# Patient Record
Sex: Male | Born: 1960
Health system: Southern US, Community
[De-identification: ages and names within clinical notes are randomized; demographics above are authoritative.]

## PROBLEM LIST (undated history)

## (undated) DIAGNOSIS — F32A Depression, unspecified: Secondary | ICD-10-CM

## (undated) DIAGNOSIS — M1990 Unspecified osteoarthritis, unspecified site: Secondary | ICD-10-CM

## (undated) DIAGNOSIS — F329 Major depressive disorder, single episode, unspecified: Secondary | ICD-10-CM

## (undated) DIAGNOSIS — I10 Essential (primary) hypertension: Secondary | ICD-10-CM

## (undated) DIAGNOSIS — IMO0002 Reserved for concepts with insufficient information to code with codable children: Secondary | ICD-10-CM

## (undated) DIAGNOSIS — E119 Type 2 diabetes mellitus without complications: Secondary | ICD-10-CM

## (undated) DIAGNOSIS — K219 Gastro-esophageal reflux disease without esophagitis: Secondary | ICD-10-CM

## (undated) HISTORY — DX: Type 2 diabetes mellitus without complications: E11.9

## (undated) HISTORY — DX: Major depressive disorder, single episode, unspecified: F32.9

## (undated) HISTORY — DX: Unspecified osteoarthritis, unspecified site: M19.90

## (undated) HISTORY — DX: Reserved for concepts with insufficient information to code with codable children: IMO0002

## (undated) HISTORY — DX: Essential (primary) hypertension: I10

## (undated) HISTORY — DX: Depression, unspecified: F32.A

## (undated) HISTORY — DX: Gastro-esophageal reflux disease without esophagitis: K21.9

---

## 1993-01-28 HISTORY — PX: LUMBAR LAMINECTOMY: SHX95

## 1996-01-29 DIAGNOSIS — IMO0002 Reserved for concepts with insufficient information to code with codable children: Secondary | ICD-10-CM

## 1996-01-29 HISTORY — DX: Reserved for concepts with insufficient information to code with codable children: IMO0002

## 1998-09-21 ENCOUNTER — Emergency Department (HOSPITAL_COMMUNITY): Admission: EM | Admit: 1998-09-21 | Discharge: 1998-09-21 | Payer: Self-pay | Admitting: Emergency Medicine

## 1998-09-21 ENCOUNTER — Encounter: Payer: Self-pay | Admitting: Emergency Medicine

## 1999-09-12 ENCOUNTER — Encounter
Admission: RE | Admit: 1999-09-12 | Discharge: 1999-10-02 | Payer: Self-pay | Admitting: Physical Medicine & Rehabilitation

## 1999-10-08 ENCOUNTER — Encounter
Admission: RE | Admit: 1999-10-08 | Discharge: 1999-10-11 | Payer: Self-pay | Admitting: Physical Medicine & Rehabilitation

## 2000-01-29 HISTORY — PX: NECK SURGERY: SHX720

## 2000-08-25 ENCOUNTER — Ambulatory Visit: Admission: RE | Admit: 2000-08-25 | Discharge: 2000-08-25 | Payer: Self-pay | Admitting: Neurosurgery

## 2000-10-29 ENCOUNTER — Encounter: Payer: Self-pay | Admitting: Neurosurgery

## 2000-10-31 ENCOUNTER — Encounter: Payer: Self-pay | Admitting: Neurosurgery

## 2000-10-31 ENCOUNTER — Observation Stay (HOSPITAL_COMMUNITY): Admission: RE | Admit: 2000-10-31 | Discharge: 2000-11-01 | Payer: Self-pay | Admitting: Neurosurgery

## 2000-11-12 ENCOUNTER — Encounter: Payer: Self-pay | Admitting: Neurosurgery

## 2000-11-12 ENCOUNTER — Encounter: Admission: RE | Admit: 2000-11-12 | Discharge: 2000-11-12 | Payer: Self-pay | Admitting: Neurosurgery

## 2001-02-11 ENCOUNTER — Encounter: Admission: RE | Admit: 2001-02-11 | Discharge: 2001-03-31 | Payer: Self-pay | Admitting: Neurosurgery

## 2001-07-02 ENCOUNTER — Encounter: Admission: RE | Admit: 2001-07-02 | Discharge: 2001-07-02 | Payer: Self-pay | Admitting: Neurosurgery

## 2002-05-10 ENCOUNTER — Encounter: Payer: Self-pay | Admitting: Internal Medicine

## 2002-05-10 ENCOUNTER — Inpatient Hospital Stay (HOSPITAL_COMMUNITY): Admission: AD | Admit: 2002-05-10 | Discharge: 2002-05-13 | Payer: Self-pay | Admitting: Internal Medicine

## 2002-05-11 ENCOUNTER — Encounter: Payer: Self-pay | Admitting: Internal Medicine

## 2002-05-12 ENCOUNTER — Encounter (INDEPENDENT_AMBULATORY_CARE_PROVIDER_SITE_OTHER): Payer: Self-pay | Admitting: *Deleted

## 2002-05-13 ENCOUNTER — Encounter (INDEPENDENT_AMBULATORY_CARE_PROVIDER_SITE_OTHER): Payer: Self-pay | Admitting: Specialist

## 2003-05-13 ENCOUNTER — Encounter
Admission: RE | Admit: 2003-05-13 | Discharge: 2003-08-11 | Payer: Self-pay | Admitting: Physical Medicine & Rehabilitation

## 2003-07-05 ENCOUNTER — Encounter
Admission: RE | Admit: 2003-07-05 | Discharge: 2003-09-05 | Payer: Self-pay | Admitting: Physical Medicine & Rehabilitation

## 2003-08-18 ENCOUNTER — Encounter
Admission: RE | Admit: 2003-08-18 | Discharge: 2003-10-07 | Payer: Self-pay | Admitting: Physical Medicine & Rehabilitation

## 2003-10-07 ENCOUNTER — Encounter
Admission: RE | Admit: 2003-10-07 | Discharge: 2004-01-05 | Payer: Self-pay | Admitting: Physical Medicine & Rehabilitation

## 2003-10-10 ENCOUNTER — Ambulatory Visit: Payer: Self-pay | Admitting: Physical Medicine & Rehabilitation

## 2004-01-03 ENCOUNTER — Encounter
Admission: RE | Admit: 2004-01-03 | Discharge: 2004-01-03 | Payer: Self-pay | Admitting: Physical Medicine & Rehabilitation

## 2004-01-09 ENCOUNTER — Encounter
Admission: RE | Admit: 2004-01-09 | Discharge: 2004-02-27 | Payer: Self-pay | Admitting: Physical Medicine & Rehabilitation

## 2004-02-27 ENCOUNTER — Encounter
Admission: RE | Admit: 2004-02-27 | Discharge: 2004-05-27 | Payer: Self-pay | Admitting: Physical Medicine & Rehabilitation

## 2004-02-29 ENCOUNTER — Ambulatory Visit: Payer: Self-pay | Admitting: Physical Medicine & Rehabilitation

## 2004-05-02 ENCOUNTER — Ambulatory Visit: Payer: Self-pay | Admitting: Physical Medicine & Rehabilitation

## 2004-06-06 ENCOUNTER — Encounter
Admission: RE | Admit: 2004-06-06 | Discharge: 2004-09-04 | Payer: Self-pay | Admitting: Physical Medicine & Rehabilitation

## 2004-08-03 ENCOUNTER — Ambulatory Visit: Payer: Self-pay | Admitting: Physical Medicine & Rehabilitation

## 2004-09-05 ENCOUNTER — Encounter
Admission: RE | Admit: 2004-09-05 | Discharge: 2004-12-04 | Payer: Self-pay | Admitting: Physical Medicine and Rehabilitation

## 2004-10-04 ENCOUNTER — Ambulatory Visit: Payer: Self-pay | Admitting: Physical Medicine & Rehabilitation

## 2004-11-06 ENCOUNTER — Ambulatory Visit: Payer: Self-pay | Admitting: Physical Medicine & Rehabilitation

## 2004-12-03 ENCOUNTER — Encounter
Admission: RE | Admit: 2004-12-03 | Discharge: 2005-03-03 | Payer: Self-pay | Admitting: Physical Medicine & Rehabilitation

## 2005-01-12 ENCOUNTER — Ambulatory Visit: Payer: Self-pay | Admitting: Physical Medicine & Rehabilitation

## 2005-03-11 ENCOUNTER — Encounter
Admission: RE | Admit: 2005-03-11 | Discharge: 2005-06-09 | Payer: Self-pay | Admitting: Physical Medicine & Rehabilitation

## 2005-03-11 ENCOUNTER — Ambulatory Visit: Payer: Self-pay | Admitting: Physical Medicine & Rehabilitation

## 2005-04-11 ENCOUNTER — Ambulatory Visit: Payer: Self-pay | Admitting: Physical Medicine & Rehabilitation

## 2005-05-16 ENCOUNTER — Ambulatory Visit: Payer: Self-pay | Admitting: Physical Medicine & Rehabilitation

## 2005-06-12 ENCOUNTER — Encounter
Admission: RE | Admit: 2005-06-12 | Discharge: 2005-09-10 | Payer: Self-pay | Admitting: Physical Medicine & Rehabilitation

## 2005-06-12 ENCOUNTER — Ambulatory Visit: Payer: Self-pay | Admitting: Physical Medicine & Rehabilitation

## 2005-07-11 ENCOUNTER — Ambulatory Visit: Payer: Self-pay | Admitting: Physical Medicine & Rehabilitation

## 2005-08-02 ENCOUNTER — Encounter: Admission: RE | Admit: 2005-08-02 | Discharge: 2005-10-31 | Payer: Self-pay | Admitting: Anesthesiology

## 2005-08-06 ENCOUNTER — Ambulatory Visit: Payer: Self-pay | Admitting: Anesthesiology

## 2005-10-10 ENCOUNTER — Ambulatory Visit: Payer: Self-pay | Admitting: Physical Medicine & Rehabilitation

## 2005-10-10 ENCOUNTER — Encounter: Admission: RE | Admit: 2005-10-10 | Discharge: 2006-01-08 | Payer: Self-pay | Admitting: Urology

## 2005-11-11 ENCOUNTER — Encounter: Admission: RE | Admit: 2005-11-11 | Discharge: 2006-02-09 | Payer: Self-pay | Admitting: Anesthesiology

## 2005-11-12 ENCOUNTER — Ambulatory Visit: Payer: Self-pay | Admitting: Anesthesiology

## 2005-12-09 ENCOUNTER — Ambulatory Visit: Payer: Self-pay | Admitting: Physical Medicine & Rehabilitation

## 2006-02-05 ENCOUNTER — Encounter
Admission: RE | Admit: 2006-02-05 | Discharge: 2006-05-06 | Payer: Self-pay | Admitting: Physical Medicine & Rehabilitation

## 2006-02-05 ENCOUNTER — Ambulatory Visit: Payer: Self-pay | Admitting: Physical Medicine & Rehabilitation

## 2006-03-31 ENCOUNTER — Encounter
Admission: RE | Admit: 2006-03-31 | Discharge: 2006-03-31 | Payer: Self-pay | Admitting: Physical Medicine & Rehabilitation

## 2007-05-09 ENCOUNTER — Encounter: Payer: Self-pay | Admitting: Family Medicine

## 2007-05-27 ENCOUNTER — Ambulatory Visit: Payer: Self-pay | Admitting: Family Medicine

## 2007-05-27 DIAGNOSIS — I152 Hypertension secondary to endocrine disorders: Secondary | ICD-10-CM | POA: Insufficient documentation

## 2007-05-27 DIAGNOSIS — F329 Major depressive disorder, single episode, unspecified: Secondary | ICD-10-CM | POA: Insufficient documentation

## 2007-05-27 DIAGNOSIS — K219 Gastro-esophageal reflux disease without esophagitis: Secondary | ICD-10-CM | POA: Insufficient documentation

## 2007-05-27 DIAGNOSIS — M1990 Unspecified osteoarthritis, unspecified site: Secondary | ICD-10-CM | POA: Insufficient documentation

## 2007-05-27 DIAGNOSIS — F3289 Other specified depressive episodes: Secondary | ICD-10-CM | POA: Insufficient documentation

## 2007-05-27 DIAGNOSIS — E1159 Type 2 diabetes mellitus with other circulatory complications: Secondary | ICD-10-CM | POA: Insufficient documentation

## 2007-05-27 DIAGNOSIS — E1165 Type 2 diabetes mellitus with hyperglycemia: Secondary | ICD-10-CM | POA: Insufficient documentation

## 2007-05-27 DIAGNOSIS — M545 Low back pain, unspecified: Secondary | ICD-10-CM | POA: Insufficient documentation

## 2007-05-27 DIAGNOSIS — Z862 Personal history of diseases of the blood and blood-forming organs and certain disorders involving the immune mechanism: Secondary | ICD-10-CM | POA: Insufficient documentation

## 2007-05-27 DIAGNOSIS — E785 Hyperlipidemia, unspecified: Secondary | ICD-10-CM

## 2007-05-27 DIAGNOSIS — I10 Essential (primary) hypertension: Secondary | ICD-10-CM

## 2007-05-27 DIAGNOSIS — E1169 Type 2 diabetes mellitus with other specified complication: Secondary | ICD-10-CM | POA: Insufficient documentation

## 2007-07-02 ENCOUNTER — Ambulatory Visit: Payer: Self-pay | Admitting: Family Medicine

## 2007-07-13 ENCOUNTER — Ambulatory Visit: Payer: Self-pay | Admitting: Family Medicine

## 2007-07-15 ENCOUNTER — Ambulatory Visit: Payer: Self-pay | Admitting: Family Medicine

## 2007-07-16 LAB — CONVERTED CEMR LAB
ALT: 35 units/L (ref 0–53)
AST: 39 units/L — ABNORMAL HIGH (ref 0–37)
Albumin: 3.4 g/dL — ABNORMAL LOW (ref 3.5–5.2)
Alkaline Phosphatase: 50 units/L (ref 39–117)
BUN: 5 mg/dL — ABNORMAL LOW (ref 6–23)
CO2: 30 meq/L (ref 19–32)
Chloride: 98 meq/L (ref 96–112)
Cholesterol: 193 mg/dL (ref 0–200)
Creatinine, Ser: 0.9 mg/dL (ref 0.4–1.5)
Creatinine,U: 106.2 mg/dL
GFR calc non Af Amer: 96 mL/min
Glucose, Bld: 116 mg/dL — ABNORMAL HIGH (ref 70–99)
LDL Cholesterol: 131 mg/dL — ABNORMAL HIGH (ref 0–99)
Potassium: 4.7 meq/L (ref 3.5–5.1)
Total Protein: 7.7 g/dL (ref 6.0–8.3)
Triglycerides: 189 mg/dL — ABNORMAL HIGH (ref 0–149)

## 2007-07-27 ENCOUNTER — Telehealth: Payer: Self-pay | Admitting: Family Medicine

## 2007-08-04 ENCOUNTER — Encounter: Payer: Self-pay | Admitting: Family Medicine

## 2007-09-22 ENCOUNTER — Telehealth: Payer: Self-pay | Admitting: Family Medicine

## 2007-10-29 HISTORY — PX: ESOPHAGOGASTRODUODENOSCOPY: SHX1529

## 2007-11-11 ENCOUNTER — Ambulatory Visit: Payer: Self-pay | Admitting: Family Medicine

## 2007-11-20 LAB — CONVERTED CEMR LAB
Direct LDL: 114.4 mg/dL
HDL: 37.8 mg/dL — ABNORMAL LOW (ref >39.0)
Hgb A1c MFr Bld: 10.5 % — ABNORMAL HIGH (ref 4.6–6.0)
Total CHOL/HDL Ratio: 5.2
VLDL: 55 mg/dL — ABNORMAL HIGH (ref 0–40)

## 2007-11-30 LAB — HM COLONOSCOPY

## 2007-12-02 ENCOUNTER — Encounter: Payer: Self-pay | Admitting: Family Medicine

## 2007-12-04 ENCOUNTER — Ambulatory Visit: Payer: Self-pay | Admitting: Family Medicine

## 2007-12-04 LAB — CONVERTED CEMR LAB: Cholesterol, target level: 200 mg/dL

## 2007-12-17 ENCOUNTER — Encounter: Payer: Self-pay | Admitting: Family Medicine

## 2008-01-11 ENCOUNTER — Telehealth: Payer: Self-pay | Admitting: Family Medicine

## 2008-02-17 ENCOUNTER — Telehealth: Payer: Self-pay | Admitting: Family Medicine

## 2008-03-02 ENCOUNTER — Ambulatory Visit: Payer: Self-pay | Admitting: Family Medicine

## 2008-03-03 LAB — CONVERTED CEMR LAB
AST: 47 units/L — ABNORMAL HIGH (ref 0–37)
Albumin: 3.8 g/dL (ref 3.5–5.2)
Alkaline Phosphatase: 37 units/L — ABNORMAL LOW (ref 39–117)
BUN: 10 mg/dL (ref 6–23)
Chloride: 95 meq/L — ABNORMAL LOW (ref 96–112)
Cholesterol: 164 mg/dL (ref 0–200)
GFR calc Af Amer: 132 mL/min
GFR calc non Af Amer: 109 mL/min
Hgb A1c MFr Bld: 8.6 % — ABNORMAL HIGH (ref 4.6–6.0)
LDL Cholesterol: 83 mg/dL (ref 0–99)
Potassium: 4.5 meq/L (ref 3.5–5.1)
Total Bilirubin: 0.6 mg/dL (ref 0.3–1.2)
Total CHOL/HDL Ratio: 2.6
VLDL: 17 mg/dL (ref 0–40)

## 2008-03-07 ENCOUNTER — Ambulatory Visit: Payer: Self-pay | Admitting: Family Medicine

## 2008-03-07 DIAGNOSIS — M6740 Ganglion, unspecified site: Secondary | ICD-10-CM | POA: Insufficient documentation

## 2008-03-28 ENCOUNTER — Telehealth: Payer: Self-pay | Admitting: Family Medicine

## 2008-04-05 ENCOUNTER — Encounter: Payer: Self-pay | Admitting: Family Medicine

## 2008-05-11 ENCOUNTER — Telehealth: Payer: Self-pay | Admitting: Family Medicine

## 2008-05-13 ENCOUNTER — Telehealth: Payer: Self-pay | Admitting: Family Medicine

## 2008-06-14 ENCOUNTER — Ambulatory Visit: Payer: Self-pay | Admitting: Family Medicine

## 2008-06-16 ENCOUNTER — Ambulatory Visit: Payer: Self-pay | Admitting: Family Medicine

## 2008-06-16 DIAGNOSIS — F528 Other sexual dysfunction not due to a substance or known physiological condition: Secondary | ICD-10-CM | POA: Insufficient documentation

## 2008-07-12 ENCOUNTER — Telehealth: Payer: Self-pay | Admitting: Family Medicine

## 2008-08-22 ENCOUNTER — Telehealth: Payer: Self-pay | Admitting: Family Medicine

## 2008-09-21 ENCOUNTER — Telehealth: Payer: Self-pay | Admitting: Family Medicine

## 2008-09-22 ENCOUNTER — Ambulatory Visit: Payer: Self-pay | Admitting: Family Medicine

## 2008-09-23 LAB — CONVERTED CEMR LAB
ALT: 52 units/L (ref 0–53)
Albumin: 4.4 g/dL (ref 3.5–5.2)
BUN: 7 mg/dL (ref 6–23)
CO2: 31 meq/L (ref 19–32)
Calcium: 9.7 mg/dL (ref 8.4–10.5)
Chloride: 94 meq/L — ABNORMAL LOW (ref 96–112)
Cholesterol: 185 mg/dL (ref 0–200)
Creatinine, Ser: 1 mg/dL (ref 0.4–1.5)
Creatinine,U: 137.5 mg/dL
HDL: 57.7 mg/dL (ref >39.00)
Hgb A1c MFr Bld: 7.1 % — ABNORMAL HIGH (ref 4.6–6.5)
Microalb, Ur: 7.4 mg/dL — ABNORMAL HIGH (ref 0.0–1.9)
Total CHOL/HDL Ratio: 3
Total Protein: 8.1 g/dL (ref 6.0–8.3)
Triglycerides: 96 mg/dL (ref 0.0–149.0)

## 2008-09-29 ENCOUNTER — Ambulatory Visit: Payer: Self-pay | Admitting: Family Medicine

## 2008-09-29 DIAGNOSIS — R809 Proteinuria, unspecified: Secondary | ICD-10-CM | POA: Insufficient documentation

## 2008-10-07 ENCOUNTER — Ambulatory Visit: Payer: Self-pay | Admitting: Family Medicine

## 2008-10-07 ENCOUNTER — Encounter: Admission: RE | Admit: 2008-10-07 | Discharge: 2008-10-07 | Payer: Self-pay | Admitting: Family Medicine

## 2008-10-11 ENCOUNTER — Telehealth (INDEPENDENT_AMBULATORY_CARE_PROVIDER_SITE_OTHER): Payer: Self-pay | Admitting: Internal Medicine

## 2008-11-04 ENCOUNTER — Telehealth: Payer: Self-pay | Admitting: Family Medicine

## 2008-11-07 ENCOUNTER — Telehealth (INDEPENDENT_AMBULATORY_CARE_PROVIDER_SITE_OTHER): Payer: Self-pay | Admitting: Internal Medicine

## 2008-11-30 ENCOUNTER — Encounter (INDEPENDENT_AMBULATORY_CARE_PROVIDER_SITE_OTHER): Payer: Self-pay | Admitting: Internal Medicine

## 2008-12-13 ENCOUNTER — Encounter (INDEPENDENT_AMBULATORY_CARE_PROVIDER_SITE_OTHER): Payer: Self-pay | Admitting: Internal Medicine

## 2008-12-20 ENCOUNTER — Telehealth: Payer: Self-pay | Admitting: Family Medicine

## 2008-12-26 ENCOUNTER — Telehealth: Payer: Self-pay | Admitting: Family Medicine

## 2008-12-30 ENCOUNTER — Ambulatory Visit: Payer: Self-pay | Admitting: Family Medicine

## 2008-12-30 LAB — CONVERTED CEMR LAB
ALT: 57 units/L — ABNORMAL HIGH (ref 0–53)
BUN: 3 mg/dL — ABNORMAL LOW (ref 6–23)
CO2: 32 meq/L (ref 19–32)
Chloride: 97 meq/L (ref 96–112)
Cholesterol: 176 mg/dL (ref 0–200)
Creatinine, Ser: 0.9 mg/dL (ref 0.4–1.5)
Hgb A1c MFr Bld: 8.4 % — ABNORMAL HIGH (ref 4.6–6.5)
LDL Cholesterol: 96 mg/dL (ref 0–99)
Total Protein: 8 g/dL (ref 6.0–8.3)
Triglycerides: 106 mg/dL (ref 0.0–149.0)

## 2009-01-05 ENCOUNTER — Ambulatory Visit: Payer: Self-pay | Admitting: Family Medicine

## 2009-01-05 DIAGNOSIS — F1010 Alcohol abuse, uncomplicated: Secondary | ICD-10-CM | POA: Insufficient documentation

## 2009-01-05 DIAGNOSIS — R740 Nonspecific elevation of levels of transaminase and lactic acid dehydrogenase [LDH]: Secondary | ICD-10-CM

## 2009-01-05 DIAGNOSIS — R7401 Elevation of levels of liver transaminase levels: Secondary | ICD-10-CM | POA: Insufficient documentation

## 2009-01-06 LAB — CONVERTED CEMR LAB
HCV Ab: NEGATIVE
Hep B C IgM: NEGATIVE
Hepatitis B Surface Ag: NEGATIVE
Total Bilirubin: 1.2 mg/dL (ref 0.3–1.2)

## 2009-01-13 ENCOUNTER — Telehealth: Payer: Self-pay | Admitting: Family Medicine

## 2009-03-01 ENCOUNTER — Telehealth: Payer: Self-pay | Admitting: Family Medicine

## 2009-03-02 ENCOUNTER — Encounter: Payer: Self-pay | Admitting: Family Medicine

## 2009-03-07 ENCOUNTER — Telehealth: Payer: Self-pay | Admitting: Family Medicine

## 2009-04-05 ENCOUNTER — Ambulatory Visit: Payer: Self-pay | Admitting: Family Medicine

## 2009-04-10 LAB — CONVERTED CEMR LAB
ALT: 49 units/L (ref 0–53)
AST: 68 units/L — ABNORMAL HIGH (ref 0–37)
Albumin: 4.1 g/dL (ref 3.5–5.2)
BUN: 2 mg/dL — ABNORMAL LOW (ref 6–23)
Chloride: 95 meq/L — ABNORMAL LOW (ref 96–112)
Cholesterol: 153 mg/dL (ref 0–200)
Glucose, Bld: 169 mg/dL — ABNORMAL HIGH (ref 70–99)
Hgb A1c MFr Bld: 7.7 % — ABNORMAL HIGH (ref 4.6–6.5)
Potassium: 4.3 meq/L (ref 3.5–5.1)
Total Bilirubin: 0.7 mg/dL (ref 0.3–1.2)

## 2009-04-11 ENCOUNTER — Ambulatory Visit: Payer: Self-pay | Admitting: Family Medicine

## 2009-05-16 ENCOUNTER — Telehealth: Payer: Self-pay | Admitting: Family Medicine

## 2009-06-13 ENCOUNTER — Telehealth: Payer: Self-pay | Admitting: Family Medicine

## 2009-07-10 ENCOUNTER — Ambulatory Visit: Payer: Self-pay | Admitting: Family Medicine

## 2009-07-10 LAB — CONVERTED CEMR LAB
Albumin: 4 g/dL (ref 3.5–5.2)
BUN: 8 mg/dL (ref 6–23)
Bilirubin, Direct: 0.2 mg/dL (ref 0.0–0.3)
CO2: 31 meq/L (ref 19–32)
Calcium: 9.3 mg/dL (ref 8.4–10.5)
Cholesterol: 152 mg/dL (ref 0–200)
Creatinine, Ser: 0.7 mg/dL (ref 0.4–1.5)
HDL: 56.7 mg/dL (ref >39.00)
Total CHOL/HDL Ratio: 3
Total Protein: 7.3 g/dL (ref 6.0–8.3)
Triglycerides: 87 mg/dL (ref 0.0–149.0)

## 2009-07-13 ENCOUNTER — Ambulatory Visit: Payer: Self-pay | Admitting: Internal Medicine

## 2009-07-13 ENCOUNTER — Ambulatory Visit: Payer: Self-pay | Admitting: Family Medicine

## 2009-08-16 ENCOUNTER — Ambulatory Visit: Payer: Self-pay | Admitting: Family Medicine

## 2009-08-16 DIAGNOSIS — M25519 Pain in unspecified shoulder: Secondary | ICD-10-CM | POA: Insufficient documentation

## 2009-08-22 ENCOUNTER — Telehealth: Payer: Self-pay | Admitting: Family Medicine

## 2009-08-25 ENCOUNTER — Telehealth: Payer: Self-pay | Admitting: Family Medicine

## 2009-10-04 ENCOUNTER — Telehealth: Payer: Self-pay | Admitting: Family Medicine

## 2009-10-12 ENCOUNTER — Encounter: Payer: Self-pay | Admitting: Family Medicine

## 2009-10-31 ENCOUNTER — Ambulatory Visit: Payer: Self-pay | Admitting: Family Medicine

## 2009-11-01 LAB — CONVERTED CEMR LAB
ALT: 60 units/L — ABNORMAL HIGH (ref 0–53)
AST: 106 units/L — ABNORMAL HIGH (ref 0–37)
Alkaline Phosphatase: 45 units/L (ref 39–117)
Bilirubin, Direct: 0.2 mg/dL (ref 0.0–0.3)
Total Bilirubin: 0.5 mg/dL (ref 0.3–1.2)

## 2009-11-03 ENCOUNTER — Telehealth: Payer: Self-pay | Admitting: Family Medicine

## 2009-11-07 ENCOUNTER — Ambulatory Visit: Payer: Self-pay | Admitting: Family Medicine

## 2009-11-07 DIAGNOSIS — L570 Actinic keratosis: Secondary | ICD-10-CM | POA: Insufficient documentation

## 2009-11-08 ENCOUNTER — Telehealth: Payer: Self-pay | Admitting: Family Medicine

## 2009-11-08 ENCOUNTER — Encounter: Admission: RE | Admit: 2009-11-08 | Discharge: 2009-11-08 | Payer: Self-pay | Admitting: Family Medicine

## 2009-12-14 ENCOUNTER — Telehealth: Payer: Self-pay | Admitting: Family Medicine

## 2010-01-15 ENCOUNTER — Telehealth: Payer: Self-pay | Admitting: Family Medicine

## 2010-02-02 ENCOUNTER — Other Ambulatory Visit: Payer: Self-pay | Admitting: Family Medicine

## 2010-02-02 ENCOUNTER — Ambulatory Visit
Admission: RE | Admit: 2010-02-02 | Discharge: 2010-02-02 | Payer: Self-pay | Source: Home / Self Care | Attending: Family Medicine | Admitting: Family Medicine

## 2010-02-02 LAB — LIPID PANEL
Cholesterol: 194 mg/dL (ref 0–200)
HDL: 63.7 mg/dL (ref >39.00)
LDL Cholesterol: 95 mg/dL (ref 0–99)
Total CHOL/HDL Ratio: 3
Triglycerides: 179 mg/dL — ABNORMAL HIGH (ref 0.0–149.0)
VLDL: 35.8 mg/dL (ref 0.0–40.0)

## 2010-02-02 LAB — BASIC METABOLIC PANEL
BUN: 12 mg/dL (ref 6–23)
CO2: 29 mEq/L (ref 19–32)
Calcium: 9.8 mg/dL (ref 8.4–10.5)
Chloride: 92 mEq/L — ABNORMAL LOW (ref 96–112)
Creatinine, Ser: 0.9 mg/dL (ref 0.4–1.5)
GFR: 94.56 mL/min (ref >60.00)
Glucose, Bld: 137 mg/dL — ABNORMAL HIGH (ref 70–99)
Potassium: 4.7 mEq/L (ref 3.5–5.1)
Sodium: 132 mEq/L — ABNORMAL LOW (ref 135–145)

## 2010-02-02 LAB — HEPATIC FUNCTION PANEL
ALT: 42 U/L (ref 0–53)
AST: 44 U/L — ABNORMAL HIGH (ref 0–37)
Albumin: 4 g/dL (ref 3.5–5.2)
Alkaline Phosphatase: 51 U/L (ref 39–117)
Bilirubin, Direct: 0.2 mg/dL (ref 0.0–0.3)
Total Bilirubin: 0.8 mg/dL (ref 0.3–1.2)
Total Protein: 7.5 g/dL (ref 6.0–8.3)

## 2010-02-02 LAB — HEMOGLOBIN A1C: Hgb A1c MFr Bld: 6.9 % — ABNORMAL HIGH (ref 4.6–6.5)

## 2010-02-09 ENCOUNTER — Ambulatory Visit
Admission: RE | Admit: 2010-02-09 | Discharge: 2010-02-09 | Payer: Self-pay | Source: Home / Self Care | Attending: Family Medicine | Admitting: Family Medicine

## 2010-02-09 LAB — HM DIABETES FOOT EXAM

## 2010-02-21 ENCOUNTER — Telehealth: Payer: Self-pay | Admitting: Family Medicine

## 2010-02-27 ENCOUNTER — Encounter: Payer: Self-pay | Admitting: Family Medicine

## 2010-02-27 ENCOUNTER — Telehealth: Payer: Self-pay | Admitting: Family Medicine
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
## 2010-02-27 NOTE — Progress Notes (Signed)
Summary: flexril  Phone Note Refill Request Message from:  [employer] on November 03, 2009 9:16 AM  Refills Requested: Medication #1:  FLEXERIL 10 MG  TABS Takes 1 tablet by mouth every 8 hours.   Supply Requested: 1 month [facility] [address]   Method Requested: Telephone to Pharmacy Initial call taken by: Benny Lennert CMA Duncan Dull),  November 03, 2009 9:16 AM  Follow-up for Phone Call        Rx called to pharmacy Follow-up by: Benny Lennert CMA Duncan Dull),  November 03, 2009 10:49 AM    Prescriptions: FLEXERIL 10 MG  TABS (CYCLOBENZAPRINE HCL) Takes 1 tablet by mouth every 8 hours.  #90 x 0   Entered and Authorized by:   Kerby Nora MD   Signed by:   Kerby Nora MD on 11/03/2009   Method used:   Telephoned to ...       [facility]  [address] [phone]* (retail)       [address]       [place], [place]  [address]       Ph: [phone]       Fax: [phone]   RxID:   [id number]

## 2010-02-27 NOTE — Assessment & Plan Note (Signed)
Summary: hurt shoulder/alc   Vital Signs:  Patient profile:   50 year old male Height:      71 inches Weight:      230.2 pounds BMI:     32.22 Temp:     98.0 degrees F oral Pulse rate:   86 / minute Pulse rhythm:   regular BP sitting:   130 / 82  (left arm) Cuff size:   large  Vitals Entered By: Benny Lennert CMA Duncan Dull) (August 16, 2009 2:31 PM)  History of Present Illness: Chief complaint check shoulder  Right shoulder pain...when walking felt dizzy...fell forward...landed on right shoulder 1 week ago. No swelling, immmediate pain.   Pain at rest, increased with abduction, adduction and ext/int rotation. Minimal improvement...pain is 4-8/10  On oxycodone for back pain chronic. (Due for refill)  Mild dizzyness associated with Januvia.   No shoulder problems in past.  Problems Prior to Update: 1)  Pain in Joint, Shoulder Region  (ICD-719.41) 2)  Preventive Health Care  (ICD-V70.0) 3)  Special Screening Malignant Neoplasm of Prostate  (ICD-V76.44) 4)  Alcohol Abuse  (ICD-305.00) 5)  Transaminases, Serum, Elevated  (ICD-790.4) 6)  Microalbuminuria  (ICD-791.0) 7)  Erectile Dysfunction  (ICD-302.72) 8)  Ganglion Cyst, Tendon Sheath  (ICD-727.42) 9)  Hyperlipidemia  (ICD-272.4) 10)  Family History of Colon Ca 1st Degree Relative <60  (ICD-V16.0) 11)  Family History of Cad Male 1st Degree Relative <50  (ICD-V17.3) 12)  Depression  (ICD-311) 13)  Anemia, Hx of  (ICD-V12.3) 14)  Gerd  (ICD-530.81) 15)  Osteoarthritis  (ICD-715.90) 16)  Back Pain, Lumbar, Chronic  (ICD-724.2) 17)  Diabetes Mellitus, Type II  (ICD-250.00) 18)  Hypertension  (ICD-401.9)  Current Medications (verified): 1)  Lisinopril-Hydrochlorothiazide 20-25 Mg  Tabs (Lisinopril-Hydrochlorothiazide) .... Take 1 Tablet By Mouth Once A Day 2)  Metformin Hcl 500 Mg Xr24h-Tab (Metformin Hcl) .... 4 Tab By Mouth Daily 3)  Flexeril 10 Mg  Tabs (Cyclobenzaprine Hcl) .... Takes 1 Tablet By Mouth Every 8 Hours. 4)   Omeprazole 20 Mg  Tbec (Omeprazole) .... Take 1 Tablet By Mouth Once A Day 5)  Oxycodone Hcl 5 Mg Tabs (Oxycodone Hcl) .Marland Kitchen.. 1 Tab By Mouth Twice Daily As Needed Pain 6)  Simvastatin 40 Mg Tabs (Simvastatin) .Marland Kitchen.. 1 Tab By Mouth Daily 7)  Aspirin 81 Mg  Tabs (Aspirin) .... Take 1 Tablet By Mouth Once A Day 8)  Levitra 20 Mg Tabs (Vardenafil Hcl) .Marland Kitchen.. 1 Tab By Mouth As Needed Sexual Activity 9)  Januvia 100 Mg Tabs (Sitagliptin Phosphate) .Marland Kitchen.. 1 Tab By Mouth Daily 10)  Diclofenac Sodium 75 Mg Tbec (Diclofenac Sodium) .Marland Kitchen.. 1 Tab By Mouth Two Times A Day  Allergies: 1)  ! Codeine 2)  ! Steroids  Past History:  Past medical, surgical, family and social histories (including risk factors) reviewed, and no changes noted (except as noted below).  Past Medical History: Reviewed history from 05/27/2007 and no changes required. Hypertension Diabetes mellitus, type II Osteoarthritis GERD Depression  Past Surgical History: Reviewed history from 12/09/2007 and no changes required. 1995 laminectomy lumbar accident at work caused herniated discs in neck thoracic and lumbar spine 1998 reinjured , became disabled 2002 neck surgery Cardiolyte nml 2002 EKG: abnormal, but heart work up nml 10/2007 EGD antral erosions, hiatal hernia  Family History: Reviewed history from 05/27/2007 and no changes required. father: CABG, MI age 75, emphesema, carotid stenosis Family History of CAD Male 1st degree relative <50 mother: age 66 colon cancer Family History of  Colon CA 1st degree relative <60 brother: healthy paternal family CAD at [place] age  Social History: Reviewed history from 05/27/2007 and no changes required. Occupation:disablity retirement Married no children Former Smoker 60 pack year Alcohol use-yes, 4-6 beers daily, on weekend 12 pack  Regular exercise-no Diet: fried foods, bacon  Physical Exam  General:  obese appearing male in NAD Mouth:  MMM Neck:  No deformities, masses, or  tenderness noted. Lungs:  Normal respiratory effort, chest expands symmetrically. Lungs are clear to auscultation, no crackles or wheezes. Heart:  Normal rate and regular rhythm. S1 and S2 normal without gallop, murmur, click, rub or other extra sounds. Msk:  right shoulder...contusion and mild swelling noted anteriorly..pain with int and ext rotation, limited abduction due to pain  neg empty can/Neer's  Neg crossover Focal ttp anterior shoulder/subacromially...no clavical or scapular ttp.  Pulses:  R and L posterior tibial pulses are full and equal bilaterally  Neurologic:  No cranial nerve deficits noted. Station and gait are normal.  Sensory, motor and coordinative functions appear intact.   Impression & Recommendations:  Problem # 1:  PAIN IN JOINT, SHOULDER REGION (ICD-719.41) Likely contusion and shoulder bursitis.  X-ray shoulder negative per office read.  Start ice, ROM exercsies and antiinflammatory.  Follow up if  not improving in 2 weeks.  His updated medication list for this problem includes:    Flexeril 10 Mg Tabs (Cyclobenzaprine hcl) .Marland Kitchen... Takes 1 tablet by mouth every 8 hours.    Oxycodone Hcl 5 Mg Tabs (Oxycodone hcl) .Marland Kitchen... 1 tab by mouth twice daily as needed pain    Aspirin 81 Mg Tabs (Aspirin) .Marland Kitchen... Take 1 tablet by mouth once a day    Diclofenac Sodium 75 Mg Tbec (Diclofenac sodium) .Marland Kitchen... 1 tab by mouth two times a day  Orders: T-Shoulder Right (73030TC)  Complete Medication List: 1)  Lisinopril-hydrochlorothiazide 20-25 Mg Tabs (Lisinopril-hydrochlorothiazide) .... Take 1 tablet by mouth once a day 2)  Metformin Hcl 500 Mg Xr24h-tab (Metformin hcl) .... 4 tab by mouth daily 3)  Flexeril 10 Mg Tabs (Cyclobenzaprine hcl) .... Takes 1 tablet by mouth every 8 hours. 4)  Omeprazole 20 Mg Tbec (Omeprazole) .... Take 1 tablet by mouth once a day 5)  Oxycodone Hcl 5 Mg Tabs (Oxycodone hcl) .Marland Kitchen.. 1 tab by mouth twice daily as needed pain 6)  Simvastatin 40 Mg Tabs  (Simvastatin) .Marland Kitchen.. 1 tab by mouth daily 7)  Aspirin 81 Mg Tabs (Aspirin) .... Take 1 tablet by mouth once a day 8)  Levitra 20 Mg Tabs (Vardenafil hcl) .Marland Kitchen.. 1 tab by mouth as needed sexual activity 9)  Januvia 100 Mg Tabs (Sitagliptin phosphate) .Marland Kitchen.. 1 tab by mouth daily 10)  Diclofenac Sodium 75 Mg Tbec (Diclofenac sodium) .Marland Kitchen.. 1 tab by mouth two times a day  Patient Instructions: 1)  Start gentle ROM exercise, ice and antinflammatory. 2)   Follow up if not imprving in 2 weeks.  Prescriptions: DICLOFENAC SODIUM 75 MG TBEC (DICLOFENAC SODIUM) 1 tab by mouth two times a day  #30 x 0   Entered and Authorized by:   Kerby Nora MD   Signed by:   Kerby Nora MD on 08/16/2009   Method used:   Print then Give to Patient   RxID:   [id number] OXYCODONE HCL 5 MG TABS (OXYCODONE HCL) 1 tab by mouth twice daily as needed pain  #60 x 0   Entered and Authorized by:   Kerby Nora MD   Signed by:   Linton Rump  Stancil Deisher MD on 08/16/2009   Method used:   Print then Give to Patient   RxID:   [id number]   Current Allergies (reviewed today): ! CODEINE ! STEROIDS

## 2010-02-27 NOTE — Progress Notes (Signed)
Summary: OXYCODONE HCL 5 MG TABS  Phone Note Call from Patient Call back at Work Phone [phone]   Caller: Patient Call For: Kerby Nora MD Summary of Call: Patient is requesting refill on OXYCODONE HCL 5 MG TABS. Patient also wanted me to let you know that he does not feel like the 5mg  is enough and wants a higher dose. Please call patient when ready for pick up.  Initial call taken by: Melody Comas,  March 01, 2009 2:07 PM  Follow-up for Phone Call        Notify pt we never heard back answer from him about getting liver US..see hepatic panel addendum. As far as increasing pain medication..I counsled him to use the lowest dose possible or narcotic..continuing to escalate dose due to developing tolerance is not advisable. I know he has seen neurosurggeon and pain center in past..have him make appt to discuss other options for chronic pain like amitryptiline, lyrica, neuronit or cymbalta.  Follow-up by: Kerby Nora MD,  March 01, 2009 5:08 PM  Additional Follow-up for Phone Call Additional follow up Details #1::        Patient said that we will schedule f/u appt to discuss this situation.  Rx put in your IN box to be signed.  He would like to pick up Rx tomorrow.   Additional Follow-up by: Linde Gillis CMA Duncan Dull),  March 02, 2009 12:18 PM    Prescriptions: OXYCODONE HCL 5 MG TABS (OXYCODONE HCL) 1 tab by mouth q 6 hours as needed pain  #60 x 0   Entered and Authorized by:   Kerby Nora MD   Signed by:   Kerby Nora MD on 03/01/2009   Method used:   Print then Give to Patient   RxID:   [id number]   Appended Document: OXYCODONE HCL 5 MG TABS Left message on cell phone voicemail that Rx is ready for pickup, will be left at front desk

## 2010-02-27 NOTE — Progress Notes (Signed)
Summary: oxycodone  Phone Note Refill Request Message from:  Patient on October 04, 2009 1:25 PM  Refills Requested: Medication #1:  OXYCODONE HCL 5 MG TABS 1 tab by mouth twice daily as needed pain Please call patient when ready. Patient says that he is completely out.    Method Requested: Pick up at Office Initial call taken by: Melody Comas,  October 04, 2009 1:26 PM  Follow-up for Phone Call        Remind patient that he should never wait until the day that he runs out to call for a refill. Call 1 week ahead of time. Follow-up by: Hannah Beat MD,  October 04, 2009 1:28 PM  Additional Follow-up for Phone Call Additional follow up Details #1::        Patient advised as instructed via telephone, Rx ready for pick up will be left at front desk.  He says he will pick it up tomorrow. Additional Follow-up by: Linde Gillis CMA Duncan Dull),  October 04, 2009 3:32 PM    Prescriptions: OXYCODONE HCL 5 MG TABS (OXYCODONE HCL) 1 tab by mouth twice daily as needed pain  #60 x 0   Entered and Authorized by:   Hannah Beat MD   Signed by:   Hannah Beat MD on 10/04/2009   Method used:   Print then Give to Patient   RxID:   [id number]

## 2010-02-27 NOTE — Progress Notes (Signed)
Summary: refill request for oxycodone  Phone Note Refill Request Call back at Work Phone [phone] Message from:  Patient  Refills Requested: Medication #1:  OXYCODONE HCL 5 MG TABS 1 tab by mouth twice daily as needed pain Please call pt when ready.  Initial call taken by: Lowella Petties CMA,  May 16, 2009 12:06 PM  Follow-up for Phone Call        Dr. Ermalene Searing printed rx but, didnt sign can you please do this for me Follow-up by: Benny Lennert CMA Duncan Dull),  May 17, 2009 9:17 AM  Additional Follow-up for Phone Call Additional follow up Details #1::        patinet advised rx ready for pick up.Consuello Masse CMA  Additional Follow-up by: Benny Lennert CMA Duncan Dull),  May 17, 2009 9:25 AM    Prescriptions: OXYCODONE HCL 5 MG TABS (OXYCODONE HCL) 1 tab by mouth twice daily as needed pain  #60 x 0   Entered and Authorized by:   Hannah Beat MD   Signed by:   Hannah Beat MD on 05/17/2009   Method used:   Print then Give to Patient   RxID:   [id number] OXYCODONE HCL 5 MG TABS (OXYCODONE HCL) 1 tab by mouth twice daily as needed pain  #60 x 0   Entered and Authorized by:   Kerby Nora MD   Signed by:   Kerby Nora MD on 05/16/2009   Method used:   Print then Give to Patient   RxID:   [id number]

## 2010-02-27 NOTE — Progress Notes (Signed)
Summary: flexirile  Phone Note Refill Request Message from:  Scriptline on Jun 13, 2009 9:22 AM  Refills Requested: Medication #1:  cyclobenziprine   Supply Requested: 1 month   Notes: not on medication list [facility] [address]   Method Requested: Electronic Initial call taken by: Benny Lennert CMA Duncan Dull),  Jun 13, 2009 9:22 AM  Follow-up for Phone Call        Rx called to pharmacy Follow-up by: Benny Lennert CMA Duncan Dull),  Jun 13, 2009 10:44 AM    Prescriptions: FLEXERIL 10 MG  TABS (CYCLOBENZAPRINE HCL) Takes 1 tablet by mouth every 8 hours.  #90 x 0   Entered and Authorized by:   Kerby Nora MD   Signed by:   Kerby Nora MD on 06/13/2009   Method used:   Telephoned to ...       [facility]  [address] [phone]* (retail)       [address]       [place], [place]  [address]       Ph: [phone]       Fax: [phone]   RxID:   [id number]

## 2010-02-27 NOTE — Assessment & Plan Note (Signed)
Summary: 3 m f/u dlo 30 min per md/   Vital Signs:  Patient profile:   50 year old male Height:      71 inches Weight:      232.4 pounds BMI:     32.53 Temp:     97.7 degrees F oral Pulse rate:   88 / minute Pulse rhythm:   regular BP sitting:   128 / 72  (left arm) Cuff size:   large  Vitals Entered By: Benny Lennert CMA Duncan Dull) (April 11, 2009 12:01 PM)  History of Present Illness: Chief complaint 3 month follow up per md  DM, now on metformin max dose now for 2 months. Tolerating well.   HTN, well controlled.   Chronic back pain..had epidural injections once at [facility]..helped some, but unable to repeat. Insurance won't pay for acupuncture.  Oxycodone keeping pain managable. Using flexeril .   LFTs trending down on oxcodoine without tylenol. HAs not decreased alcohol use though.  Continue statin.   Hypertension History:      He denies headache, chest pain, palpitations, dyspnea with exertion, neurologic problems, and side effects from treatment.  Well controlled .        Positive major cardiovascular risk factors include male age 86 years old or older, diabetes, hyperlipidemia, and hypertension.  Negative major cardiovascular risk factors include non-tobacco-user status.    Lipid Management History:      Positive NCEP/ATP III risk factors include male age 54 years old or older, diabetes, and hypertension.  Negative NCEP/ATP III risk factors include HDL cholesterol greater than 60 and non-tobacco-user status.        His compliance with the TLC diet is good.  The patient expresses understanding of adjunctive measures for cholesterol lowering.  Adjunctive measures started by the patient include aerobic exercise, fiber, folic acid, and weight reduction.  He expresses no side effects from his lipid-lowering medication.  The patient denies any symptoms to suggest myopathy or liver disease.      Problems Prior to Update: 1)  Alcohol Abuse  (ICD-305.00) 2)  Transaminases, Serum,  Elevated  (ICD-790.4) 3)  Neck Pain, Acute  (ICD-723.1) 4)  Microalbuminuria  (ICD-791.0) 5)  Erectile Dysfunction  (ICD-302.72) 6)  Ganglion Cyst, Tendon Sheath  (ICD-727.42) 7)  Hyperlipidemia  (ICD-272.4) 8)  Family History of Colon Ca 1st Degree Relative <60  (ICD-V16.0) 9)  Family History of Cad Male 1st Degree Relative <50  (ICD-V17.3) 10)  Depression  (ICD-311) 11)  Anemia, Hx of  (ICD-V12.3) 12)  Gerd  (ICD-530.81) 13)  Osteoarthritis  (ICD-715.90) 14)  Back Pain, Lumbar, Chronic  (ICD-724.2) 15)  Diabetes Mellitus, Type II  (ICD-250.00) 16)  Hypertension  (ICD-401.9)  Current Medications (verified): 1)  Lisinopril-Hydrochlorothiazide 20-25 Mg  Tabs (Lisinopril-Hydrochlorothiazide) .... Take 1 Tablet By Mouth Once A Day 2)  Metformin Hcl 500 Mg Xr24h-Tab (Metformin Hcl) .... 4 Tab By Mouth Daily 3)  Flexeril 10 Mg  Tabs (Cyclobenzaprine Hcl) .... Takes 1 Tablet By Mouth Every 8 Hours. 4)  Omeprazole 20 Mg  Tbec (Omeprazole) .... Take 1 Tablet By Mouth Once A Day 5)  Oxycodone Hcl 5 Mg Tabs (Oxycodone Hcl) .Marland Kitchen.. 1 Tab By Mouth Twice Daily As Needed Pain 6)  Simvastatin 40 Mg Tabs (Simvastatin) .Marland Kitchen.. 1 Tab By Mouth Daily 7)  Aspirin 81 Mg  Tabs (Aspirin) .... Take 1 Tablet By Mouth Once A Day 8)  Viagra 100 Mg Tabs (Sildenafil Citrate) .... 1/2 -1 Tab By Mouth At Bedtime As Needed Ed  Allergies: 1)  ! Codeine 2)  ! Steroids  Past History:  Past medical, surgical, family and social histories (including risk factors) reviewed, and no changes noted (except as noted below).  Past Medical History: Reviewed history from 05/27/2007 and no changes required. Hypertension Diabetes mellitus, type II Osteoarthritis GERD Depression  Past Surgical History: Reviewed history from 12/09/2007 and no changes required. 1995 laminectomy lumbar accident at work caused herniated discs in neck thoracic and lumbar spine 1998 reinjured , became disabled 2002 neck surgery Cardiolyte nml  2002 EKG: abnormal, but heart work up nml 10/2007 EGD antral erosions, hiatal hernia  Family History: Reviewed history from 05/27/2007 and no changes required. father: CABG, MI age 6, emphesema, carotid stenosis Family History of CAD Male 1st degree relative <50 mother: age 40 colon cancer Family History of Colon CA 1st degree relative <60 brother: healthy paternal family CAD at [place] age  Social History: Reviewed history from 05/27/2007 and no changes required. Occupation:disablity retirement Married no children Former Smoker 60 pack year Alcohol use-yes, 4-6 beers daily, on weekend 12 pack  Regular exercise-no Diet: fried foods, bacon  Review of Systems General:  Denies fatigue and fever. CV:  Denies chest pain or discomfort. Resp:  Denies shortness of breath. GI:  Denies abdominal pain.  Physical Exam  General:  obese appearing male in NAD Mouth:  MMM Neck:  no carotid bruit or thyromegaly no cervical or supraclavicular lymphadenopathy  Lungs:  Normal respiratory effort, chest expands symmetrically. Lungs are clear to auscultation, no crackles or wheezes. Heart:  Normal rate and regular rhythm. S1 and S2 normal without gallop, murmur, click, rub or other extra sounds. Abdomen:  Bowel sounds positive,abdomen soft and non-tender without masses, organomegaly or hernias noted. Msk:  decrease neck rotation, back flexion, ttp over mid thoracic spine, pain in lumbar spine with rotation Pulses:  R and L posterior tibial pulses are full and equal bilaterally  Extremities:  no edema   Diabetes Management Exam:    Foot Exam (with socks and/or shoes not present):       Sensory-Monofilament:          Left foot: diminished          Right foot: diminished       Inspection:          Left foot: normal          Right foot: normal       Nails:          Left foot: normal          Right foot: normal   Impression & Recommendations:  Problem # 1:  TRANSAMINASES, SERUM, ELEVATED  (ICD-790.4) Some improvement off tylenol. Encouraged alcohol cessation, discussed liver issues and other complicatons with alcoholism.   Problem # 2:  HYPERLIPIDEMIA (ICD-272.4)  Improved control. Continue current meds.  His updated medication list for this problem includes:    Simvastatin 40 Mg Tabs (Simvastatin) .Marland Kitchen... 1 tab by mouth daily  Labs Reviewed: SGOT: 68 (04/05/2009)   SGPT: 49 (04/05/2009)  Lipid Goals: Chol Goal: 200 (12/04/2007)   HDL Goal: 40 (12/04/2007)   LDL Goal: 100 (12/04/2007)   TG Goal: 150 (12/04/2007)  10 Yr Risk Heart Disease: 4 % Prior 10 Yr Risk Heart Disease: 9 % (09/29/2008)   HDL:71.20 (04/05/2009), 58.90 (12/30/2008)  LDL:59 (04/05/2009), 96 (12/30/2008)  Chol:153 (04/05/2009), 176 (12/30/2008)  Trig:112.0 (04/05/2009), 106.0 (12/30/2008)  Problem # 3:  DIABETES MELLITUS, TYPE II (ICD-250.00) Improving control..on max metformin only 50month..contiue aggressive  diet changes. If not at goal at next 3 month check consider adding glipizide/januvia His updated medication list for this problem includes:    Lisinopril-hydrochlorothiazide 20-25 Mg Tabs (Lisinopril-hydrochlorothiazide) .Marland Kitchen... Take 1 tablet by mouth once a day    Metformin Hcl 500 Mg Xr24h-tab (Metformin hcl) .Marland KitchenMarland KitchenMarland KitchenMarland Kitchen 4 tab by mouth daily    Aspirin 81 Mg Tabs (Aspirin) .Marland Kitchen... Take 1 tablet by mouth once a day  Problem # 4:  HYPERTENSION (ICD-401.9)  Well controlled. Continue current medication.  His updated medication list for this problem includes:    Lisinopril-hydrochlorothiazide 20-25 Mg Tabs (Lisinopril-hydrochlorothiazide) .Marland Kitchen... Take 1 tablet by mouth once a day  BP today: 128/72 Prior BP: 110/60 (01/05/2009)  10 Yr Risk Heart Disease: 4 % Prior 10 Yr Risk Heart Disease: 9 % (09/29/2008)  Labs Reviewed: K+: 4.3 (04/05/2009) Creat: : 0.8 (04/05/2009)   Chol: 153 (04/05/2009)   HDL: 71.20 (04/05/2009)   LDL: 59 (04/05/2009)   TG: 112.0 (04/05/2009)  Problem # 5:  BACK PAIN, LUMBAR,  CHRONIC (ICD-724.2) Encouraged weight loss, stretching and return to [facility] for urther injections.  His updated medication list for this problem includes:    Flexeril 10 Mg Tabs (Cyclobenzaprine hcl) .Marland Kitchen... Takes 1 tablet by mouth every 8 hours.    Oxycodone Hcl 5 Mg Tabs (Oxycodone hcl) .Marland Kitchen... 1 tab by mouth twice daily as needed pain    Aspirin 81 Mg Tabs (Aspirin) .Marland Kitchen... Take 1 tablet by mouth once a day  Complete Medication List: 1)  Lisinopril-hydrochlorothiazide 20-25 Mg Tabs (Lisinopril-hydrochlorothiazide) .... Take 1 tablet by mouth once a day 2)  Metformin Hcl 500 Mg Xr24h-tab (Metformin hcl) .... 4 tab by mouth daily 3)  Flexeril 10 Mg Tabs (Cyclobenzaprine hcl) .... Takes 1 tablet by mouth every 8 hours. 4)  Omeprazole 20 Mg Tbec (Omeprazole) .... Take 1 tablet by mouth once a day 5)  Oxycodone Hcl 5 Mg Tabs (Oxycodone hcl) .Marland Kitchen.. 1 tab by mouth twice daily as needed pain 6)  Simvastatin 40 Mg Tabs (Simvastatin) .Marland Kitchen.. 1 tab by mouth daily 7)  Aspirin 81 Mg Tabs (Aspirin) .... Take 1 tablet by mouth once a day 8)  Viagra 100 Mg Tabs (Sildenafil citrate) .... 1/2 -1 tab by mouth at bedtime as needed ed  Hypertension Assessment/Plan:      The patient's hypertensive risk group is category C: Target organ damage and/or diabetes.  His calculated 10 year risk of coronary heart disease is 4 %.  Today's blood pressure is 128/72.  His blood pressure goal is < 130/80.  Lipid Assessment/Plan:      Based on NCEP/ATP III, the patient's risk factor category is "history of diabetes".  The patient's lipid goals are as follows: Total cholesterol goal is 200; LDL cholesterol goal is 100; HDL cholesterol goal is 40; Triglyceride goal is 150.  His LDL cholesterol goal has been met.  Secondary causes for hyperlipidemia have been ruled out.  He has been counseled on adjunctive measures for lowering his cholesterol and has been provided with dietary instructions.    Patient Instructions: 1)  BMP prior to visit,  ICD-9: 250.00 2)  Hepatic Panel prior to visit ICD-9:  3)  Lipid panel prior to visit ICD-9 :  4)  HgBA1c prior to visit  ICD-9:  5)  PSA Dx v76.44 6)  Please schedule a follow-up appointment in 3 months CPX 7)  See your eye doctor yearly to check for diabetic eye damage. Prescriptions: VIAGRA 100 MG TABS (SILDENAFIL CITRATE) 1/2 -1 tab  by mouth at bedtime as needed ED  #9 x 2   Entered by:   Benny Lennert CMA (AAMA)   Authorized by:   Kerby Nora MD   Signed by:   Benny Lennert CMA (AAMA) on 04/11/2009   Method used:   Print then Give to Patient   RxID:   [id number] FLEXERIL 10 MG  TABS (CYCLOBENZAPRINE HCL) Takes 1 tablet by mouth every 8 hours.  #90 x 0   Entered by:   Benny Lennert CMA (AAMA)   Authorized by:   Kerby Nora MD   Signed by:   Benny Lennert CMA (AAMA) on 04/11/2009   Method used:   Print then Give to Patient   RxID:   [id number] OXYCODONE HCL 5 MG TABS (OXYCODONE HCL) 1 tab by mouth q 6 hours as needed pain  #60 x 0   Entered by:   Benny Lennert CMA (AAMA)   Authorized by:   Kerby Nora MD   Signed by:   Benny Lennert CMA (AAMA) on 04/11/2009   Method used:   Print then Give to Patient   RxID:   [id number] LISINOPRIL-HYDROCHLOROTHIAZIDE 20-25 MG  TABS (LISINOPRIL-HYDROCHLOROTHIAZIDE) Take 1 tablet by mouth once a day  #34 x 3   Entered by:   Benny Lennert CMA (AAMA)   Authorized by:   Kerby Nora MD   Signed by:   Benny Lennert CMA (AAMA) on 04/11/2009   Method used:   Print then Give to Patient   RxID:   [id number]   Current Allergies (reviewed today): ! CODEINE ! STEROIDS

## 2010-02-27 NOTE — Letter (Signed)
Summary: [facility]  [facility]   Imported By: Lanelle Bal 03/13/2009 09:02:40  _____________________________________________________________________  External Attachment:    Type:   Image     Comment:   External Document

## 2010-02-27 NOTE — Progress Notes (Signed)
Summary: OXYCODONE  Phone Note Refill Request Call back at Home Phone [phone] Message from:  Patient on December 14, 2009 1:45 PM  Refills Requested: Medication #1:  OXYCODONE HCL 5 MG TABS 1 tab by mouth twice daily as needed pain   Supply Requested: 1 month CALL PATIENT WHEN READY   Method Requested: Pick up at Office Initial call taken by: Benny Lennert CMA Duncan Dull),  December 14, 2009 1:46 PM  Follow-up for Phone Call        [place].Marland Kitchenwould you mind refilling this for me since I will not be in the office for a while..I cannot print out and sign. I would have Karleen Hampshire do it, but I hear he has a concussion from wrestling with his dog.  oxycodone 5 mg #60 0RF   This is a gentleman with chronic back pain who sticks to his pain contract well.  Follow-up by: Kerby Nora MD,  December 14, 2009 4:19 PM  Additional Follow-up for Phone Call Additional follow up Details #1::        np.  done, given to [facility] to call pt.   Additional Follow-up by: Eustaquio Boyden  MD,  December 14, 2009 5:28 PM    Additional Follow-up for Phone Call Additional follow up Details #2::    accidentally printed out script with [facility] name.  shredded and printed out script with my name on it Follow-up by: Eustaquio Boyden  MD,  December 15, 2009 8:11 AM  Additional Follow-up for Phone Call Additional follow up Details #3:: Details for Additional Follow-up Action Taken: Left message on home machine and on cell phone voice mail notifying patient that Rx was ready for pick up. Additional Follow-up by: Janee Morn CMA Duncan Dull),  December 15, 2009 8:35 AM  Prescriptions: OXYCODONE HCL 5 MG TABS (OXYCODONE HCL) 1 tab by mouth twice daily as needed pain  #60 x 0   Entered and Authorized by:   Eustaquio Boyden  MD   Signed by:   Eustaquio Boyden  MD on 12/15/2009   Method used:   Print then Give to Patient   RxID:   [id number] OXYCODONE HCL 5 MG TABS (OXYCODONE HCL) 1 tab by mouth twice daily as needed  pain  #60 x 0   Entered by:   Eustaquio Boyden  MD   Authorized by:   Kerby Nora MD   Signed by:   Eustaquio Boyden  MD on 12/14/2009   Method used:   Print then Give to Patient   RxID:   [id number]

## 2010-02-27 NOTE — Letter (Signed)
Summary: Eye Exam/[facility]  Eye Exam/[facility]   Imported By: Maryln Gottron 10/30/2009 14:19:23  _____________________________________________________________________  External Attachment:    Type:   Image     Comment:   External Document

## 2010-02-27 NOTE — Progress Notes (Signed)
Summary: Rx FLexeril  Phone Note Refill Request Call back at [phone] Message from:  [facility] on March 07, 2009 3:35 PM  Refills Requested: Medication #1:  FLEXERIL 10 MG  TABS Takes 1 tablet by mouth every 8 hours.   Last Refilled: 01/13/2009 Received e-script request, please advise.   Method Requested: Electronic Initial call taken by: Linde Gillis CMA Duncan Dull),  March 07, 2009 3:39 PM  Follow-up for Phone Call        Rx called to pharmacy Follow-up by: Linde Gillis CMA Duncan Dull),  March 08, 2009 8:15 AM    Prescriptions: FLEXERIL 10 MG  TABS (CYCLOBENZAPRINE HCL) Takes 1 tablet by mouth every 8 hours.  #90 x 0   Entered and Authorized by:   Kerby Nora MD   Signed by:   Kerby Nora MD on 03/07/2009   Method used:   Telephoned to ...       [facility]  [address] [phone]* (retail)       [address]       [place], [place]  [address]       Ph: [phone]       Fax: [phone]   RxID:   [id number]

## 2010-02-27 NOTE — Progress Notes (Signed)
Summary: flexiril  Phone Note Refill Request Message from:  [facility] on August 22, 2009 7:36 AM  Refills Requested: Medication #1:  FLEXERIL 10 MG  TABS Takes 1 tablet by mouth every 8 hours.   Supply Requested: 1 month [facility] [address]    Method Requested: Telephone to Pharmacy Initial call taken by: Benny Lennert CMA Duncan Dull),  August 22, 2009 7:36 AM  Follow-up for Phone Call        filled 06/13/2009  even if taking maximally three times a day, then taking too much. denied. Follow-up by: Hannah Beat MD,  August 22, 2009 5:45 PM  Additional Follow-up for Phone Call Additional follow up Details #1::        Pharmacy advised.Consuello Masse CMA   Additional Follow-up by: Benny Lennert CMA (AAMA),  August 23, 2009 8:00 AM

## 2010-02-27 NOTE — Progress Notes (Signed)
Summary: water blisters  Phone Note Call from Patient Call back at Work Phone [phone]   Caller: Patient Call For: Kerby Nora MD Summary of Call: Patient says that a few of the places that were frozen at last office visit have now turned into water blisters. He is asking if he should drain them or is anything nees to be done. Please advise. Patient says to leave message with instrucions.  Initial call taken by: Melody Comas,  November 08, 2009 10:52 AM  Follow-up for Phone Call        This is expected (I thought I reviewed with him) normally turn to blisters...leave them ALONE...if need to... cover them up with bandaid and neosporin. Popping these would increase the risk of infection. Follow-up by: Kerby Nora MD,  November 08, 2009 10:57 AM  Additional Follow-up for Phone Call Additional follow up Details #1::        Patient advised via message on cell phone  Additional Follow-up by: Benny Lennert CMA Duncan Dull),  November 08, 2009 11:27 AM

## 2010-02-27 NOTE — Progress Notes (Signed)
  Phone Note Refill Request Message from:  [facility] on August 25, 2009 12:32 PM  Refills Requested: Medication #1:  FLEXERIL 10 MG  TABS Takes 1 tablet by mouth every 8 hours.   Supply Requested: [facility] month [facility]   Follow-up for Phone Call        Rx called to pharmacy Follow-up by: Benny Lennert CMA Duncan Dull),  August 25, 2009 2:41 PM    Prescriptions: FLEXERIL 10 MG  TABS (CYCLOBENZAPRINE HCL) Takes 1 tablet by mouth every 8 hours.  #90 x 0   Entered and Authorized by:   Kerby Nora MD   Signed by:   Kerby Nora MD on 08/25/2009   Method used:   Telephoned to ...       [facility]  [address] [phone]* (retail)       [address]       [place], [place]  [address]       Ph: [phone]       Fax: [phone]   RxID:   [id number]   Prior Medications: LISINOPRIL-HYDROCHLOROTHIAZIDE 20-25 MG  TABS (LISINOPRIL-HYDROCHLOROTHIAZIDE) Take 1 tablet by mouth once a day METFORMIN HCL 500 MG XR24H-TAB (METFORMIN HCL) 4 tab by mouth daily FLEXERIL 10 MG  TABS (CYCLOBENZAPRINE HCL) Takes 1 tablet by mouth every 8 hours. OMEPRAZOLE 20 MG  TBEC (OMEPRAZOLE) Take 1 tablet by mouth once a day OXYCODONE HCL 5 MG TABS (OXYCODONE HCL) 1 tab by mouth twice daily as needed pain SIMVASTATIN 40 MG TABS (SIMVASTATIN) 1 tab by mouth daily ASPIRIN 81 MG  TABS (ASPIRIN) Take 1 tablet by mouth once a day LEVITRA 20 MG TABS (VARDENAFIL HCL) 1 tab by mouth as needed sexual activity JANUVIA 100 MG TABS (SITAGLIPTIN PHOSPHATE) 1 tab by mouth daily DICLOFENAC SODIUM 75 MG TBEC (DICLOFENAC SODIUM) 1 tab by mouth two times a day Current Allergies: ! CODEINE ! STEROIDS

## 2010-03-01 NOTE — Assessment & Plan Note (Signed)
Summary: 3 m f/u dlo   Vital Signs:  Patient profile:   50 year old male Height:      71 inches Weight:      232.50 pounds BMI:     32.54 Temp:     98.5 degrees F oral Pulse rate:   80 / minute Pulse rhythm:   regular BP sitting:   130 / 80  (left arm) Cuff size:   large  Vitals Entered By: Benny Lennert CMA Duncan Dull) (February 09, 2010 11:08 AM)  History of Present Illness: Chief complaint 3 month follow up   DM much improved since last OV on actos now. FBS ...not checking at home. Plans to start taking Bragg's vinegar daily.  LFTs.Marland Kitchen almost normal... does continue to drink, limiting tylenol, on statin med.    Hypertension History:      He denies headache, chest pain, palpitations, dyspnea with exertion, peripheral edema, neurologic problems, and syncope.  HAs not taken BP med yet today.        Positive major cardiovascular risk factors include male age 45 years old or older, diabetes, hyperlipidemia, and hypertension.  Negative major cardiovascular risk factors include non-tobacco-user status.     Problems Prior to Update: 1)  Actinic Keratosis  (ICD-702.0) 2)  Pain in Joint, Shoulder Region  (ICD-719.41) 3)  Preventive Health Care  (ICD-V70.0) 4)  Special Screening Malignant Neoplasm of Prostate  (ICD-V76.44) 5)  Alcohol Abuse  (ICD-305.00) 6)  Transaminases, Serum, Elevated  (ICD-790.4) 7)  Microalbuminuria  (ICD-791.0) 8)  Erectile Dysfunction  (ICD-302.72) 9)  Ganglion Cyst, Tendon Sheath  (ICD-727.42) 10)  Hyperlipidemia  (ICD-272.4) 11)  Family History of Colon Ca 1st Degree Relative <60  (ICD-V16.0) 12)  Family History of Cad Male 1st Degree Relative <50  (ICD-V17.3) 13)  Depression  (ICD-311) 14)  Anemia, Hx of  (ICD-V12.3) 15)  Gerd  (ICD-530.81) 16)  Osteoarthritis  (ICD-715.90) 17)  Back Pain, Lumbar, Chronic  (ICD-724.2) 18)  Diabetes Mellitus, Type II  (ICD-250.00) 19)  Hypertension  (ICD-401.9)  Current Medications (verified): 1)   Lisinopril-Hydrochlorothiazide 20-25 Mg  Tabs (Lisinopril-Hydrochlorothiazide) .... Take 1 Tablet By Mouth Once A Day 2)  Metformin Hcl 500 Mg Xr24h-Tab (Metformin Hcl) .... 4 Tab By Mouth Daily 3)  Flexeril 10 Mg  Tabs (Cyclobenzaprine Hcl) .... Takes 1 Tablet By Mouth Every 8 Hours. 4)  Omeprazole 20 Mg  Tbec (Omeprazole) .... Take 1 Tablet By Mouth Once A Day 5)  Oxycodone Hcl 5 Mg Tabs (Oxycodone Hcl) .Marland Kitchen.. 1 Tab By Mouth Twice Daily As Needed Pain 6)  Simvastatin 40 Mg Tabs (Simvastatin) .Marland Kitchen.. 1 Tab By Mouth Daily 7)  Aspirin 81 Mg  Tabs (Aspirin) .... Take 1 Tablet By Mouth Once A Day 8)  Levitra 20 Mg Tabs (Vardenafil Hcl) .Marland Kitchen.. 1 Tab By Mouth As Needed Sexual Activity 9)  Diclofenac Sodium 75 Mg Tbec (Diclofenac Sodium) .Marland Kitchen.. 1 Tab By Mouth Two Times A Day 10)  Actos 30 Mg Tabs (Pioglitazone Hcl) .Marland Kitchen.. 1 Tab By Mouth Daily  Allergies: 1)  ! Codeine 2)  ! Steroids  Past History:  Past medical, surgical, family and social histories (including risk factors) reviewed, and no changes noted (except as noted below).  Past Medical History: Reviewed history from 05/27/2007 and no changes required. Hypertension Diabetes mellitus, type II Osteoarthritis GERD Depression  Past Surgical History: 1995 laminectomy lumbar accident at work caused herniated discs in neck thoracic and lumbar spine 1998 reinjured , became disabled 2002 neck surgery Cardiolyte nml  2002 EKG: abnormal, but heart work up nml 10/2007 EGD antral erosions, hiatal hernia 01/24/2010.. release of Duputrynd contraction.  Family History: Reviewed history from 05/27/2007 and no changes required. father: CABG, MI age 39, emphesema, carotid stenosis Family History of CAD Male 1st degree relative <50 mother: age 22 colon cancer Family History of Colon CA 1st degree relative <60 brother: healthy paternal family CAD at [place] age  Social History: Reviewed history from 05/27/2007 and no changes  required. Occupation:disablity retirement Married no children Former Smoker 60 pack year Alcohol use-yes, 4-6 beers daily, on weekend 12 pack  Regular exercise-no Diet: fried foods, bacon  Review of Systems General:  Denies fatigue and fever. CV:  Denies chest pain or discomfort; no peripheral sweling. Marland Kitchen Resp:  Denies shortness of breath. GI:  Denies abdominal pain and bloody stools. GU:  Denies dysuria.  Physical Exam  General:  obese appearing male in NAD Mouth:  Oral mucosa and oropharynx without lesions or exudates.  Teeth in good repair. Neck:  No deformities, masses, or tenderness noted. Lungs:  Normal respiratory effort, chest expands symmetrically. Lungs are clear to auscultation, no crackles or wheezes. Heart:  Normal rate and regular rhythm. S1 and S2 normal without gallop, murmur, click, rub or other extra sounds. Pulses:  R and L posterior tibial pulses are full and equal bilaterally  Extremities:  no edema Skin:  Intact without suspicious lesions or rashes  Diabetes Management Exam:    Foot Exam (with socks and/or shoes not present):       Sensory-Pinprick/Light touch:          Left medial foot (L-4): normal          Left dorsal foot (L-5): normal          Left lateral foot (S-1): normal          Right medial foot (L-4): normal          Right dorsal foot (L-5): normal          Right lateral foot (S-1): normal       Sensory-Monofilament:          Left foot: diminished          Right foot: diminished       Inspection:          Left foot: normal          Right foot: normal       Nails:          Left foot: normal          Right foot: bruised right toenail.   Impression & Recommendations:  Problem # 1:  DIABETES MELLITUS, TYPE II (ICD-250.00)  Improved control. Continue current meds. Encouraged exercise, weight loss, healthy eating habits.  His updated medication list for this problem includes:    Lisinopril-hydrochlorothiazide 20-25 Mg Tabs  (Lisinopril-hydrochlorothiazide) .Marland Kitchen... Take 1 tablet by mouth once a day    Metformin Hcl 500 Mg Xr24h-tab (Metformin hcl) .Marland KitchenMarland KitchenMarland KitchenMarland Kitchen 4 tab by mouth daily    Aspirin 81 Mg Tabs (Aspirin) .Marland Kitchen... Take 1 tablet by mouth once a day    Actos 30 Mg Tabs (Pioglitazone hcl) .Marland Kitchen... 1 tab by mouth daily  Labs Reviewed: Creat: 0.9 (02/02/2010)     Last Eye Exam: normal (09/29/2007) Reviewed HgBA1c results: 6.9 (02/02/2010)  7.8 (10/31/2009)  Problem # 2:  TRANSAMINASES, SERUM, ELEVATED (ICD-790.4) Assessment: Improved  Problem # 3:  HYPERLIPIDEMIA (ICD-272.4) LDL at goal..triglycerides high.. add fish oil. recheck in 3 months.  His updated medication list for this problem includes:    Simvastatin 40 Mg Tabs (Simvastatin) .Marland Kitchen... 1 tab by mouth daily  Problem # 4:  HYPERTENSION (ICD-401.9)  Well controlled. Continue current medication.  His updated medication list for this problem includes:    Lisinopril-hydrochlorothiazide 20-25 Mg Tabs (Lisinopril-hydrochlorothiazide) .Marland Kitchen... Take 1 tablet by mouth once a day  BP today: 130/80 Prior BP: 110/70 (11/07/2009)  Prior 10 Yr Risk Heart Disease: 6 % (07/13/2009)  Labs Reviewed: K+: 4.7 (02/02/2010) Creat: : 0.9 (02/02/2010)   Chol: 194 (02/02/2010)   HDL: 63.70 (02/02/2010)   LDL: 95 (02/02/2010)   TG: 179.0 (02/02/2010)  Complete Medication List: 1)  Lisinopril-hydrochlorothiazide 20-25 Mg Tabs (Lisinopril-hydrochlorothiazide) .... Take 1 tablet by mouth once a day 2)  Metformin Hcl 500 Mg Xr24h-tab (Metformin hcl) .... 4 tab by mouth daily 3)  Flexeril 10 Mg Tabs (Cyclobenzaprine hcl) .... Takes 1 tablet by mouth every 8 hours. 4)  Omeprazole 20 Mg Tbec (Omeprazole) .... Take 1 tablet by mouth once a day 5)  Oxycodone Hcl 5 Mg Tabs (Oxycodone hcl) .Marland Kitchen.. 1 tab by mouth twice daily as needed pain 6)  Simvastatin 40 Mg Tabs (Simvastatin) .Marland Kitchen.. 1 tab by mouth daily 7)  Aspirin 81 Mg Tabs (Aspirin) .... Take 1 tablet by mouth once a day 8)  Levitra 20 Mg  Tabs (Vardenafil hcl) .Marland Kitchen.. 1 tab by mouth as needed sexual activity 9)  Diclofenac Sodium 75 Mg Tbec (Diclofenac sodium) .Marland Kitchen.. 1 tab by mouth two times a day 10)  Actos 30 Mg Tabs (Pioglitazone hcl) .Marland Kitchen.. 1 tab by mouth daily  Hypertension Assessment/Plan:      The patient's hypertensive risk group is category C: Target organ damage and/or diabetes.  His calculated 10 year risk of coronary heart disease is 6 %.  Today's blood pressure is 130/80.  His blood pressure goal is < 130/80.   Patient Instructions: 1)  Consider fish oil 200 mg daily to reduce heart disease risk. 2)  Please schedule a follow-up appointment in 3 months .  3)  BMP prior to visit, ICD-9: 250.00 4)  Hepatic Panel prior to visit ICD-9:  5)  Lipid panel prior to visit ICD-9 :  6)  HgBA1c prior to visit  ICD-9:    Orders Added: 1)  Est. Patient Level IV [04540]    Current Allergies (reviewed today): ! CODEINE ! STEROIDS

## 2010-03-01 NOTE — Progress Notes (Signed)
Summary: refill request for oxycodone  Phone Note Refill Request Call back at Work Phone [phone] Message from:  Patient  Refills Requested: Medication #1:  OXYCODONE HCL 5 MG TABS 1 tab by mouth twice daily as needed pain   Last Refilled: 12/15/2009 Phoned request from pt. He has 6 left.  Please call when ready.  Per Herbert Seta, request sent to you because script needs to be printed.  Initial call taken by: Lowella Petties CMA, AAMA,  January 15, 2010 2:47 PM  Follow-up for Phone Call        Dr. Ermalene Searing is out of the office this week, so I will fill this controlled substance in her absence.   Follow-up by: Hannah Beat MD,  January 15, 2010 2:59 PM    Prescriptions: OXYCODONE HCL 5 MG TABS (OXYCODONE HCL) 1 tab by mouth twice daily as needed pain  #60 x 0   Entered and Authorized by:   Hannah Beat MD   Signed by:   Hannah Beat MD on 01/15/2010   Method used:   Print then Give to Patient   RxID:   [id number]   Appended Document: refill request for oxycodone Patient advised via message on machine.Consuello Masse CMA

## 2010-03-01 NOTE — Progress Notes (Signed)
Summary: refill requests for flexeril, levitra, oxycodone  Phone Note Refill Request Call back at Work Phone [phone] Message from:  Patient  Refills Requested: Medication #1:  FLEXERIL 10 MG  TABS Takes 1 tablet by mouth every 8 hours.  Medication #2:  LEVITRA 20 MG TABS 1 tab by mouth as needed sexual activity  Medication #3:  OXYCODONE HCL 5 MG TABS 1 tab by mouth twice daily as needed pain Pt wants 90 day scripts of flexeril and levitra [facility] [address], please call when oxycodone script is ready.  Initial call taken by: Lowella Petties CMA, AAMA,  February 21, 2010 11:27 AM  Follow-up for Phone Call        Print and put in my inbox. One month supply.  Follow-up by: Kerby Nora MD,  February 21, 2010 10:52 PM  Additional Follow-up for Phone Call Additional follow up Details #1::        in your inbox Additional Follow-up by: Benny Lennert CMA Duncan Dull),  February 22, 2010 8:19 AM    Prescriptions: LEVITRA 20 MG TABS (VARDENAFIL HCL) 1 tab by mouth as needed sexual activity  #10 x 0   Entered by:   Benny Lennert CMA (AAMA)   Authorized by:   Kerby Nora MD   Signed by:   Benny Lennert CMA (AAMA) on 02/22/2010   Method used:   Print then Give to Patient   RxID:   [id number] OXYCODONE HCL 5 MG TABS (OXYCODONE HCL) 1 tab by mouth twice daily as needed pain  #60 x 0   Entered by:   Benny Lennert CMA (AAMA)   Authorized by:   Kerby Nora MD   Signed by:   Benny Lennert CMA (AAMA) on 02/22/2010   Method used:   Print then Give to Patient   RxID:   [id number] FLEXERIL 10 MG  TABS (CYCLOBENZAPRINE HCL) Takes 1 tablet by mouth every 8 hours.  #90 x 0   Entered by:   Benny Lennert CMA (AAMA)   Authorized by:   Kerby Nora MD   Signed by:   Benny Lennert CMA (AAMA) on 02/22/2010   Method used:   Print then Give to Patient   RxID:   [id number]

## 2010-03-01 NOTE — Progress Notes (Signed)
Summary: flexirile  Phone Note Refill Request Message from:  [facility] on January 15, 2010 2:01 PM  Refills Requested: Medication #1:  FLEXERIL 10 MG  TABS Takes 1 tablet by mouth every 8 hours. [facility] [address]   Method Requested: Telephone to Pharmacy Initial call taken by: Benny Lennert CMA Duncan Dull),  January 15, 2010 2:02 PM    Prescriptions: FLEXERIL 10 MG  TABS (CYCLOBENZAPRINE HCL) Takes 1 tablet by mouth every 8 hours.  #90 x 0   Entered and Authorized by:   Hannah Beat MD   Signed by:   Hannah Beat MD on 01/15/2010   Method used:   Electronically to        [facility] [address] [phone]* (retail)       [address]       [place], [place]  [address]       Ph: [phone]       Fax: [phone]   RxID:   [id number]

## 2010-03-07 NOTE — Progress Notes (Signed)
Summary: forms for mail order refills  Phone Note Refill Request Message from:  Fax from Pharmacy  Refills Requested: Medication #1:  FLEXERIL 10 MG  TABS Takes 1 tablet by mouth every 8 hours.  Medication #2:  LEVITRA 20 MG TABS 1 tab by mouth as needed sexual activity  Medication #3:  METFORMIN HCL 500 MG XR24H-TAB 4 tab by mouth daily  Medication #4:  ACTOS 30 MG TABS 1 tab by mouth daily. Also simvastatin, lisinopril/ hctz.  Pt is changing over to [facility] and they have sent forms for new scripts.  Forms are on your desk.  Pt is asking if he can also get 90 day scripts for oxycodone, that he can pick up and mail in every 3 months.  Initial call taken by: Lowella Petties CMA, AAMA,  February 27, 2010 3:45 PM

## 2010-03-15 NOTE — Medication Information (Signed)
[facility]   Imported By: Kassie Mends 03/07/2010 10:15:04  _____________________________________________________________________  External Attachment:    Type:   Image     Comment:   External Document

## 2010-03-16 ENCOUNTER — Telehealth (INDEPENDENT_AMBULATORY_CARE_PROVIDER_SITE_OTHER): Payer: Self-pay | Admitting: *Deleted

## 2010-03-23 ENCOUNTER — Telehealth: Payer: Self-pay | Admitting: Family Medicine

## 2010-03-24 ENCOUNTER — Encounter: Payer: Self-pay | Admitting: Family Medicine

## 2010-03-24 LAB — HM DIABETES EYE EXAM

## 2010-03-27 NOTE — Progress Notes (Signed)
Summary: Need refill...  Phone Note Call from Patient   Caller: Patient Call For: Kerby Nora MD Summary of Call: Pt says his ins has approved for 90 days supply for Oxycodone. Pt says he is not totally out of the medication, but he wanted to call in early.Daine Gip  March 16, 2010 5:04 PM  Initial call taken by: Daine Gip,  March 16, 2010 5:04 PM  Follow-up for Phone Call        Let pt know.. that I do not call in controlled meds early...given his good hsitory with this controlled substance.. we can do 90 day supply but only when due.  Follow-up by: Kerby Nora MD,  March 16, 2010 5:08 PM  Additional Follow-up for Phone Call Additional follow up Details #1::        [facility] for patient to call.              Lowella Petties CMA, AAMA  March 16, 2010 5:35 PM     Additional Follow-up for Phone Call Additional follow up Details #2::    Patient advised.Consuello Masse CMA   Follow-up by: Benny Lennert CMA (AAMA),  March 19, 2010 9:12 AM

## 2010-03-27 NOTE — Progress Notes (Signed)
Summary: oxycodone  Phone Note Call from Patient Call back at Work Phone [phone]   Caller: Patient Call For: Kerby Nora MD Summary of Call: Patient called back and is asking if he could go ahead and get the rx for the oxycodone. He says that now he will be doing in through mail order and it will take 8 to 10 days to receive it once he has mailed it off. Please advise. Initial call taken by: Melody Comas,  March 23, 2010 1:46 PM  Follow-up for Phone Call        Rx left up front for pick up. Patient notified. Follow-up by: Melody Comas,  March 23, 2010 4:15 PM    Prescriptions: OXYCODONE HCL 5 MG TABS (OXYCODONE HCL) 1 tab by mouth twice daily as needed pain  #180 x 0   Entered and Authorized by:   Kerby Nora MD   Signed by:   Kerby Nora MD on 03/23/2010   Method used:   Print then Give to Patient   RxID:   [id number]

## 2010-04-02 ENCOUNTER — Telehealth: Payer: Self-pay | Admitting: Family Medicine

## 2010-04-03 ENCOUNTER — Telehealth: Payer: Self-pay | Admitting: Family Medicine

## 2010-04-10 NOTE — Progress Notes (Signed)
Summary: clarification needed for oxycodone script  Phone Note From Pharmacy   [facility] Summary of Call: Form from [facility] is on your desk, they are asking for clarifiction on oxycodone script. Initial call taken by: Lowella Petties CMA, AAMA,  April 02, 2010 10:25 AM  Follow-up for Phone Call        Will complete when back in poffice tommorow.  Follow-up by: Kerby Nora MD,  April 02, 2010 12:39 PM  Additional Follow-up for Phone Call Additional follow up Details #1::        done Additional Follow-up by: Kerby Nora MD,  April 03, 2010 8:19 AM    Additional Follow-up for Phone Call Additional follow up Details #2::    Form faxed.                Lowella Petties CMA, AAMA  April 03, 2010 9:01 AM

## 2010-04-10 NOTE — Progress Notes (Signed)
Summary: ocycodone  Phone Note Refill Request Message from:  Fax from Pharmacy on April 03, 2010 10:47 AM  Refills Requested: Medication #1:  OXYCODONE HCL 5 MG TABS 1 tab by mouth twice daily as needed pain Received form from [facility] to be signed. From is on your desk.   Initial call taken by: Melody Comas,  April 03, 2010 10:49 AM  Follow-up for Phone Call        Form completed.. discarded prescritpion printed.  Follow-up by: Kerby Nora MD,  April 03, 2010 2:09 PM  Additional Follow-up for Phone Call Additional follow up Details #1::        signed and faxed back to pharmacy.Consuello Masse CMA   Additional Follow-up by: Benny Lennert CMA Duncan Dull),  April 03, 2010 2:13 PM    Prescriptions: OXYCODONE HCL 5 MG TABS (OXYCODONE HCL) 1 tab by mouth twice daily as needed pain  #180 x 0   Entered and Authorized by:   Kerby Nora MD   Signed by:   Kerby Nora MD on 04/03/2010   Method used:   Print then Give to Patient   RxID:   [id number]

## 2010-04-12 ENCOUNTER — Encounter: Payer: Self-pay | Admitting: Family Medicine

## 2010-04-12 ENCOUNTER — Ambulatory Visit (INDEPENDENT_AMBULATORY_CARE_PROVIDER_SITE_OTHER): Payer: 59 | Admitting: Family Medicine

## 2010-04-12 DIAGNOSIS — R21 Rash and other nonspecific skin eruption: Secondary | ICD-10-CM

## 2010-04-16 ENCOUNTER — Telehealth: Payer: Self-pay | Admitting: Family Medicine

## 2010-04-17 NOTE — Assessment & Plan Note (Signed)
Summary: ?RASH/CLE  [facility]   Vital Signs:  Patient profile:   50 year old male Height:      71 inches Weight:      239.75 pounds BMI:     33.56 Temp:     97.6 degrees F oral Pulse rate:   84 / minute Pulse rhythm:   regular BP sitting:   112 / 68  (left arm) Cuff size:   large  Vitals Entered By: Delilah Shan CMA Ahni Bradwell Dull) (April 12, 2010 10:58 AM) CC: ? rash   History of Present Illness: Rash on trunk, legs, arm.  Itching.  "Red bumps".  No change except for changed to mail order meds, changed 03/06/10.  Unclear timing on the rash, but increased over the last 3-5 days.  Rash probably present  ~2 weeks.   No FCNAVD.   No new soaps. No known insects/exposures.    Allergies: 1)  ! Codeine 2)  ! Steroids  Review of Systems       See HPI.  Otherwise negative.    Physical Exam  General:  no apparent distress normocephalic atraumatic mucous membranes moist w/o oral lesions neck supple regular rate and rhythm clear to auscultation bilaterally ext w/o edema skin without lesions on palms/soles blanching red papules with excoriation on the back>chest, legs>arms.  confluent on the lower legs bilaterally   Impression & Recommendations:  Problem # 1:  RASH-NONVESICULAR (ICD-782.1) Unclear source.  I would start H1H2 blockage, use topical TAC as needed, and follow up as needed.  He agrees.  Persistence of symptoms will dictate further work up. Topical steroid cautions given and understood by patient.  Orders: Prescription Created Electronically [phone])  His updated medication list for this problem includes:    Triamcinolone Acetonide 0.1 % Crea (Triamcinolone acetonide) .Marland Kitchen... Aaa two times a day as needed for itching  Complete Medication List: 1)  Lisinopril-hydrochlorothiazide 20-25 Mg Tabs (Lisinopril-hydrochlorothiazide) .... Take 1 tablet by mouth once a day 2)  Metformin Hcl 500 Mg Xr24h-tab (Metformin hcl) .... 4 tab by mouth daily 3)  Flexeril 10 Mg Tabs (Cyclobenzaprine  hcl) .... Takes 1 tablet by mouth every 8 hours. 4)  Omeprazole 20 Mg Tbec (Omeprazole) .... Take 1 tablet by mouth once a day 5)  Oxycodone Hcl 5 Mg Tabs (Oxycodone hcl) .Marland Kitchen.. 1 tab by mouth twice daily as needed pain 6)  Simvastatin 40 Mg Tabs (Simvastatin) .Marland Kitchen.. 1 tab by mouth daily 7)  Aspirin 81 Mg Tabs (Aspirin) .... Take 1 tablet by mouth once a day 8)  Levitra 20 Mg Tabs (Vardenafil hcl) .Marland Kitchen.. 1 tab by mouth as needed sexual activity 9)  Actos 30 Mg Tabs (Pioglitazone hcl) .Marland Kitchen.. 1 tab by mouth daily 10)  Triamcinolone Acetonide 0.1 % Crea (Triamcinolone acetonide) .... Aaa two times a day as needed for itching  Patient Instructions: 1)  Take 10mg  of claritin a day and 20mg  of pepcid a day.  Use the cream twice a day on the spots that still itch. If you aren't gettig better, let us know.  Take care.  Prescriptions: TRIAMCINOLONE ACETONIDE 0.1 % CREA (TRIAMCINOLONE ACETONIDE) AAA two times a day as needed for itching  #60g x 1   Entered and Authorized by:   Crawford Givens MD   Signed by:   Crawford Givens MD on 04/12/2010   Method used:   Electronically to        [facility]  [address] [phone]* (retail)       [address]  [place], [place]  [address]       Ph: [phone]       Fax: [phone]   RxID:   [id number]    Orders Added: 1)  Prescription Created Electronically [G8553] 2)  Est. Patient Level III [63016]    Current Allergies (reviewed today): ! CODEINE ! STEROIDS

## 2010-04-26 NOTE — Progress Notes (Signed)
Summary: rash has gotten worse   Phone Note Call from Patient Call back at Work Phone [phone]   Caller: Patient Call For: Kerby Nora MD Summary of Call: Patient was seen on 04/12/10 for a rash. He says that now his rash is worse, has it on the palms of his hands, there are now more on his back than before, also on arms, and some on throat. He is asking if he could have something called [facility] to [facility] [address] [place]. [phone].  Initial call taken by: Melody Comas,  April 16, 2010 10:51 AM  Follow-up for Phone Call        call patient and clarify this for me.  When he says throat, does he mean neck/external or in the mouth?  thanks. Crawford Givens MD  April 16, 2010 11:00 AM   Inside of his throat.  He says he doesn't see  them so much as he can feel them.  None on the outside of his neck yet or mouth. Lugene Fuquay CMA Sanuel Ladnier Dull)  April 16, 2010 11:34 AM   Additional Follow-up for Phone Call Additional follow up Details #1::        If he is having lesions in his mouth/throat, then he needs to be seen by MD there.  We should change his meds w/o him being rechecked.  Crawford Givens MD  April 16, 2010 11:53 AM.

## 2010-04-30 ENCOUNTER — Other Ambulatory Visit: Payer: Self-pay

## 2010-05-07 ENCOUNTER — Ambulatory Visit: Payer: Self-pay | Admitting: Family Medicine

## 2010-05-09 ENCOUNTER — Encounter: Payer: Self-pay | Admitting: Family Medicine

## 2010-05-09 ENCOUNTER — Ambulatory Visit (INDEPENDENT_AMBULATORY_CARE_PROVIDER_SITE_OTHER): Payer: 59 | Admitting: Family Medicine

## 2010-05-09 VITALS — BP 130/80 | HR 80 | Temp 97.4°F | Ht 70.0 in | Wt 241.0 lb

## 2010-05-09 DIAGNOSIS — E119 Type 2 diabetes mellitus without complications: Secondary | ICD-10-CM

## 2010-05-09 DIAGNOSIS — E7800 Pure hypercholesterolemia, unspecified: Secondary | ICD-10-CM

## 2010-05-09 DIAGNOSIS — R21 Rash and other nonspecific skin eruption: Secondary | ICD-10-CM | POA: Insufficient documentation

## 2010-05-09 DIAGNOSIS — L298 Other pruritus: Secondary | ICD-10-CM | POA: Insufficient documentation

## 2010-05-09 LAB — BASIC METABOLIC PANEL
BUN: 8 mg/dL (ref 6–23)
Calcium: 9.4 mg/dL (ref 8.4–10.5)
Creatinine, Ser: 0.8 mg/dL (ref 0.4–1.5)
GFR: 111.42 mL/min (ref >60.00)
Glucose, Bld: 131 mg/dL — ABNORMAL HIGH (ref 70–99)

## 2010-05-09 LAB — RPR

## 2010-05-09 LAB — LIPID PANEL
Cholesterol: 179 mg/dL (ref 0–200)
LDL Cholesterol: 83 mg/dL (ref 0–99)
Triglycerides: 43 mg/dL (ref 0.0–149.0)
VLDL: 8.6 mg/dL (ref 0.0–40.0)

## 2010-05-09 LAB — TSH: TSH: 1.02 u[IU]/mL (ref 0.35–5.50)

## 2010-05-09 LAB — HEMOGLOBIN A1C: Hgb A1c MFr Bld: 7.3 % — ABNORMAL HIGH (ref 4.6–6.5)

## 2010-05-09 LAB — HEPATIC FUNCTION PANEL: Albumin: 3.8 g/dL (ref 3.5–5.2)

## 2010-05-09 MED ORDER — TRIAMCINOLONE ACETONIDE 0.1 % EX CREA: TOPICAL_CREAM | Freq: Two times a day (BID) | CUTANEOUS | Status: AC

## 2010-05-09 NOTE — Assessment & Plan Note (Signed)
KOH from thigh and anterior legs negative for hyphae/spores. Unclear etiology.  Spares face/neck.   Anterior leg rash not consistent with scabies, bed bugs.  Seems more eczematous/xerosis.  Refilled TCI cream and recommended moisturizing cream to start using regularly. Referral to derm for further evaluation, possible biopsy. Checked TSH, RPR today.

## 2010-05-09 NOTE — Patient Instructions (Signed)
Skin scrapings negative for fungal infection. I'm not quite sure what is causing this skin rash.  Start using moisturizing lotion to lower extremity to help with dry skin there.  May continue triamcinolone cream. Pass by [facility]'s office for referral to dermatology. We will check thyroid function today as hasn't been done in a while. Call us with questions.  Good to see you today

## 2010-05-09 NOTE — Progress Notes (Signed)
  Subjective:    Patient ID: Joshua Campbell, male    DOB: 1960/04/24, 50 y.o.   MRN: [id number]  HPI CC: f/u rash  Has been dealing with rash for last several weeks (started early March).  Very pruritic and some burning.  Not painful.  Started bilateral ankles, then spread up leg and arms/palms.  Some on abd, back.  Spares face.  Seen here last month, started on steroid cream and claritin.  Didn't get better so went to [facility] at beach, started on prednisone course.  Prednisone seemed to dry up rash but since stopped, rash returning.  Currently on steroid cream.  No new lotions, creams, shampoos, detergents, soaps.  No recent exposure to forest/brush.  Last new medicine is actos in October.  Only other change is newly getting prescription supply through mail, 90 day supply.  No fevers/chills, abd pain, nausea, SOB.  No vision changes or red eyes recently.  No joint pain.  Did have a few oral lesions at beach, since resolved.  Never had anything like this before.  Has dog but no fleas noted.  Wife without similar rash.  No recent TSH however does not endorse any heat/cold intolerance, any diarrhea, constipation, recent weight changes, or hair changes.  Review of Systems Per HPI    Objective:   Physical Exam  Constitutional: He appears well-developed and well-nourished. No distress.  HENT:  Head: Normocephalic and atraumatic.  Mouth/Throat: Oropharynx is clear and moist. No oropharyngeal exudate.  Eyes: Conjunctivae and EOM are normal. Pupils are equal, round, and reactive to light. No scleral icterus.  Neck: Normal range of motion. Neck supple. No thyromegaly present.  Cardiovascular: Normal rate, regular rhythm, normal heart sounds and intact distal pulses.   No murmur heard. Pulmonary/Chest: Effort normal and breath sounds normal. No respiratory distress. He has no wheezes. He has no rales.  Lymphadenopathy:    He has no cervical adenopathy.  Skin:             Assessment & Plan:

## 2010-05-14 ENCOUNTER — Other Ambulatory Visit: Payer: Self-pay

## 2010-05-18 ENCOUNTER — Ambulatory Visit (INDEPENDENT_AMBULATORY_CARE_PROVIDER_SITE_OTHER): Payer: 59 | Admitting: Family Medicine

## 2010-05-18 ENCOUNTER — Encounter: Payer: Self-pay | Admitting: Family Medicine

## 2010-05-18 DIAGNOSIS — M545 Low back pain, unspecified: Secondary | ICD-10-CM

## 2010-05-18 DIAGNOSIS — R21 Rash and other nonspecific skin eruption: Secondary | ICD-10-CM

## 2010-05-18 DIAGNOSIS — I10 Essential (primary) hypertension: Secondary | ICD-10-CM

## 2010-05-18 DIAGNOSIS — R7401 Elevation of levels of liver transaminase levels: Secondary | ICD-10-CM

## 2010-05-18 DIAGNOSIS — E119 Type 2 diabetes mellitus without complications: Secondary | ICD-10-CM

## 2010-05-18 DIAGNOSIS — E785 Hyperlipidemia, unspecified: Secondary | ICD-10-CM

## 2010-05-18 NOTE — Assessment & Plan Note (Signed)
Poor control... Return back to back specialist given gradual worsening.

## 2010-05-18 NOTE — Assessment & Plan Note (Signed)
LDL at goal <100 on medication. No SE.

## 2010-05-18 NOTE — Assessment & Plan Note (Signed)
Seen by Vaughan Sine.. To restart oral pred, new antibiotic and stronger topical steroid.

## 2010-05-18 NOTE — Assessment & Plan Note (Signed)
Well controlled. Continue current medication.  

## 2010-05-18 NOTE — Patient Instructions (Signed)
Try to work on low carb diet especially while on prednisone.  Try to start regular exercise routine as tolerated with back.

## 2010-05-18 NOTE — Assessment & Plan Note (Signed)
Resolved with decrease in tylenol and alcohol use. On statin med.

## 2010-05-18 NOTE — Progress Notes (Signed)
  Subjective:    Patient ID: Joshua Campbell, male    DOB: 1960-04-23, 50 y.o.   MRN: [id number]  HPI Diabetes:  Using medications without difficulties: Has been on prednisone lately ... See below Hypoglycemic episodes: Unknown , not checking Hyperglycemic episodes: Feet problems:None except rash  Blood Sugars averaging: Not checking eye exam within last year: yes, wears glasses  Hypertension:   Well controlled Using medication without problems or lightheadedness:  Chest pain with exertion:none Edema:none Short of breath:none Average home BPs: Other issues:none  Elevated Cholesterol: well controlled Using medications without problems: Muscle aches: none   Rash around ankles has worsened in last few weeks... Started on prednisone and topical steroid given at Urgent Care. Then itching and rash extended to upper body. Sent to [facility]... Saw yesterday.. Thought to be eczema, superficial infection...started on another course of prednisone, antibiotic and stronger steroid cream.  Chronic back pain.. Continues, getting worse gradually.. On oxycodone. Plans to see Dr. Danielle Dess. Cannot wash dishes without back pain.  PMH and SH reviewed.   ROS as in HPI... Otherwise noncontributary.    Diabetic foot exam: Normal inspection No skin breakdown No calluses  Normal DP pulses Normal sensation to light tough and monofilament Nails normal     Review of Systems  Constitutional: Negative for fever, fatigue and unexpected weight change.  HENT: Negative for sore throat and trouble swallowing.   Respiratory: Negative for cough, shortness of breath and wheezing.   Cardiovascular: Negative for chest pain, palpitations and leg swelling.  Gastrointestinal: Negative for nausea, abdominal pain, diarrhea, constipation and blood in stool.  Genitourinary: Negative for difficulty urinating.  Skin: Positive for rash.  Neurological: Negative for weakness, light-headedness, numbness and headaches.        Objective:   Physical Exam  Constitutional: Vital signs are normal. He appears well-developed and well-nourished.  HENT:  Head: Normocephalic.  Right Ear: Hearing normal.  Left Ear: Hearing normal.  Nose: Nose normal.  Mouth/Throat: Oropharynx is clear and moist and mucous membranes are normal.  Neck: Trachea normal. Carotid bruit is not present. No mass and no thyromegaly present.  Cardiovascular: Normal rate, regular rhythm and normal pulses.  Exam reveals no gallop, no distant heart sounds and no friction rub.   No murmur heard.      No peripheral edema  Pulmonary/Chest: Effort normal and breath sounds normal. No respiratory distress.  Skin: Skin is warm, dry and intact. No rash noted.  Psychiatric: He has a normal mood and affect. His speech is normal and behavior is normal. Thought content normal.          Assessment & Plan:

## 2010-05-18 NOTE — Assessment & Plan Note (Signed)
Worsened control.. Likely due to rash and steroid use. Previously at goal <7 on same meds. Get back on track with diet and follow once off prednisone. Encouraged pt to get glucometer to monitor at home.

## 2010-06-15 NOTE — Assessment & Plan Note (Signed)
Joshua Campbell is here in followup of his thoracic pain.  He had initial positive  results with the thoracic medial branch blocks; however, the radiofrequency  blocks performed by Dr. Stevphen Rochester were not beneficial.  He rates his pain at a  7-8/10.  He has noticed some occasional spasm.  He feels like the Flexeril  works as well as anything.  He remains on Avinza 60 mg q.12h. for baseline  control of pain.  He still has a suit pending for wrongful firing from his  job.  His worker's compensation is settled.  The pain is described as dull,  stabbing, constant.  The pain interferes with his general activity,  relationships with others and enjoyment of life on a moderate level.  The  patient remains unemployed.   SOCIAL HISTORY:  The patient continues to drink on a daily basis, 6-12 beers  a night.  He states his last beer was 8:30 last night.   REVIEW OF SYSTEMS:  Negative for any new neurological, psychiatric,  constitutional, GU, GI, or cardiorespiratory complaints.  He states his  sugars have been better controlled.   PHYSICAL EXAMINATION:  VITAL SIGNS:  Blood pressure of 133/76, pulse of 106,  respiratory rate 16, satting 98% on room air.  GENERAL:  The patient is pleasant, in no acute distress.  He is alert and  oriented x3.  Affect is generally bright and appropriate.  It is a bit flat  at times.  Gait was stable.  BACK:  Back range of motion was stable with some tenderness in the mid  thoracic area.  NEUROLOGIC:  Motor function was 5/5.  Sensory exam was normal.   ASSESSMENT:  1. Thoracic back pain, which still remains ill defined.  It appears to be      more facet related than anything else.  2. Lumbar cervical post laminectomy syndrome.  3. History of alcohol abuse.   PLAN:  1. I have nothing further to add interventionally at this time.  2. We discussed the fact that we will not continue to prescribed Avinza if      he continues to drink.  The patient voiced an understanding and  stated      that he will substantially decrease his alcohol use.  I will check a      urine drug screen today.  3. Will see the patient back in one month's time in the nurse clinic.  I      will see him back in 3 months.      Ranelle Oyster, M.D.  Electronically Signed     ZTS/MedQ  D:  12/10/2005 11:33:40  T:  12/10/2005 12:39:25  Job #:  [id number]

## 2010-06-15 NOTE — Assessment & Plan Note (Signed)
Joshua Campbell is back regarding his back pain.  He complains of more right leg  pain and numbness in both feet today.  He states that the trigger points we  performed at last visit provided him no relief, whereas they had provided  him benefit in the past.  We injected the T4-T6 levels on the right side.  Baclofen was not helpful.  Flexeril seems to help somewhat for spasm.  Avinza at60 mg q.12h. is beneficial but still he has breakthrough pain.  Patient still is awaiting word regarding hid disability and he has some  anxiety over this.   Patient rates his pain at an 8/10, describes it as burning, dull and aching.  Pain interferes with general activity, relations with others and enjoyment  of life on a moderate to severe level.  His thoracic pain seems to be most  severe.  Sleep is poor.  Pain in the legs seem to bother him more when he is  standing.  He notes more numbness in the left foot than the right. Recently  began Glucophage for diabetes.   REVIEW OF SYSTEMS:  Patient reports numbness, tingling, spasms, anxiety and  labile sugars.  Full review of systems is in the health and history section  and other pertinent positives are listed above.   SOCIAL HISTORY:  Patient is married, living with his wife.  He continues to  drink a six-pack a day of beer.   PHYSICAL EXAMINATION:  GENERAL APPEARANCE:  Patient is pleasant, in no acute  distress.  He is alert and oriented x3.  Affect is bright, a bit anxious.  VITAL SIGNS:  Blood pressure is 139/67, pulse 107, respiratory rate 16,  sating 100% on room air.  LUNGS:  Clear.  CARDIOVASCULAR:  Regular rate and rhythm.  ABDOMEN:  Soft and nontender.  NEUROLOGIC:  Gait is slightly antalgic to the right, more so than the left,  today.  Coordination is fair.  Reflexes are trace to absent in both legs.  Sensation is decreased at the distal feet, more into the toes in a glove-  like distribution.  Motor function is generally 5/5 and equal in both  legs.  Straight leg testing was equivocal.  He had fair range of motion with  flexion/extension today with some slight increase in pain with flexion  today.  Thoracic spine was notably spastic in paraspinal muscles at T4-T7.  Extension increased pain in the back, as well as compression of the  shoulders.  Flexion caused some pain, but less so than extension today.  Pain generally did not cross the midline with any maneuvers and stayed in  the right paraspinal muscle bed as well as into the perispinous region.  Motor function of the upper extremities was generally within normal limits  to his pain threshold.   ASSESSMENT:  1.  Thoracic back pain related to degenerative disc disease, questionable      facet pain with secondary myofascial symptoms, most prominent at T4-T7.  2.  History of cervical and lumbar discectomy and fusion.  I question      whether patient is having some radiculopathy in the right leg now.  3.  Peripheral neuropathy related to diabetes and alcohol use.   PLAN:  1.  Will send patient for facet blocks at T4-T6 on the right.  Will attempt      to set these up with Dr. Stevphen Rochester.  2.  I refilled Flexeril 10 mg q.8h. p.r.n. and Avinza 60 mg q.12h. #60.  3.  Will have an MRI performed of the lumbar spine which has not been done      for some time to rule out any      changes in cause for radiculopathy in the right leg.  4.  I will see patient back in follow-up after he has these thoracic      procedures.      Ranelle Oyster, M.D.  Electronically Signed     ZTS/MedQ  D:  04/15/2005 12:50:33  T:  04/16/2005 11:13:40  Job #:  [id number]

## 2010-06-15 NOTE — Assessment & Plan Note (Signed)
MEDICAL RECORD NUMBER:  [id number].   Joshua Campbell is back as the low back pain he suffered as a result of his  _______FCE__  continues to flare. He had some temporary relief with the  trigger point injections we performed on the right side, but these were not  long lasting. Pain continues to be of a stabbing spastic variety in the  right mid back region. He rates the pain as an 8 to 9/10. It improves with  rest and medications, made worse with any type of activity. He finds some  relief with moist heat and ice, but this is only fleeting. He has a hard  time driving and doing other basic activities around the home.   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   REVIEW OF SYSTEMS:  The patient reports reflux, problems with sleep, and  numbness. Otherwise cardiovascular and respiratory systems as well as  genitourinary systems are intact.   PHYSICAL EXAMINATION:  Blood pressure 125/73, pulse 89, respiratory rate 20.  Saturating 98% on room air. The patient walks with a normal gait. Affect is  bright and appropriate. Appearance is well kept. The patient remains limited  with thoracic flexion, extension, and particularly bending to the left side.  There is palpable bands of muscle in the T4 through T6 on the right side  that are tender with palpation. Difficult to assess if there is any true  spinal rotation or asymmetry there. It appears to be more muscular in  source. Motor and sensory exam are intact in both upper and lower  extremities. Lumbar movement is fair to good today. Cognitively, the patient  is appropriate.  HEART:  Regular rate and rhythm.  LUNGS:  Clear.   ASSESSMENT:  1.  Thoracic spine pain due to generalized thoracic spondylosis and      degenerative disk disease with a large secondary myofascial component      flared by recent __________ .  2.  History of  cervical/lumbar diskectomy and fusion surgeries.   PLAN:  1.  We injected the two thoracic taut trigger points at essentially T4 and      T5/T6, each with 2 cc of 1% lidocaine. The patient tolerated these well.  2.  We gave him samples of Lidoderm patches to try over the affected area.      He will begin that application tomorrow. Prescription was given if he      finds these helpful.  3.  Continue with Avinza 30 mg q.12h. and with his Elavil 25 mg q.h.s.  4.  Will change his spasm medication to Soma 350 mg t.i.d. p.r.n.  5.  I will see the patient back in about a month's time.      ZTS/MedQ  D:  02/29/2004 11:47:20  T:  02/29/2004 12:50:11  Job #:  [id number]   cc:   Olene Craven, M.D.  [address]  [address]  [place]  [place] [address]  Fax: [phone]

## 2010-06-15 NOTE — Assessment & Plan Note (Signed)
Joshua Campbell is back regarding his back pain. I had sent him for an MRI  of his  lumbar spine which was done on March 20th, which revealed multiple levels of  facet arthrosis. He had a left laminectomy defect at L5-S1. Notable disk  desiccation and narrowing with disk bulge was noted at that level. There was  a miniscule nonenhancing paramidline disk protrusion as well, which narrowed  the lateral foramen. Mild scarring was noted on the left. I had sent Joshua Campbell  for thoracic facet blocks at T4 through T7. These were scheduled for next  week. In the interim, Joshua Campbell developed some lower GI bleeding apparently  from hemorrhoids. He tells me he wanted to get this straightened out first  before he proceeded with any type of injections and he has canceled these  injections temporarily.   Joshua Campbell continues to rate his pain at about an 8/10. He remains on Avinza 60  mg q.12h. He uses Flexeril 10 mg q.8h. p.r.n. spasms. The pain is most  prominent in the thoracic spine as well as into the right hip and right leg.  Sleep is poor. The patient describes the pain as burning, constant, and  tingling particularly in the right leg. The pain in the leg is worse usually  when he stands for prolonged periods of time.   REVIEW OF SYSTEMS:  The patient reports numbness, tingling, spasms,  dizziness, limb swelling, and labile sugars. He is still drinking a six-pack  a day. Joshua Campbell noticed some numbness in both feet.   SOCIAL HISTORY:  The patient is married. Other pertinent positives listed  above.   PHYSICAL EXAMINATION:  Blood pressure is 138/63, pulse 117, respiratory rate  19, saturating 99% on room air. The patient is pleasant and in no acute  distress. He is alert and oriented times three. His affect is bright and  appropriate. Gait is generally stable. Coordination is appropriate. Reflexes  is 1+. Sensation decreased in the periphery.  Low back exam is essentially  unchanged today. Strength is only  5/5.   ASSESSMENT:  1.  Thoracic back pain related to degenerative disk disease and possible      facet arthropathy. Facet injections are pending.  2.  History of cervical and lumbar diskectomy and fusion with facet      arthropathy in the lumbar spine and small right disk protrusion with      potential foraminal impingement.  3.  Peripheral neuropathy related to alcohol use.   PLAN:  1.  Await facet blocks.  2.  Restart Avinza 60 mg q.12h., #60.  3.  Patient will use Flexeril for breakthrough pain 10 mg q.8h. p.r.n.  4.  I will be happy to make a referral for another surgical opinion      depending on the patient's preference.  5.  Consider L5-S1 transforaminal injection on the right.  6.  I will see the patient back in one month's time.      Joshua Campbell, M.D.  Electronically Signed     ZTS/MedQ  D:  05/17/2005 14:27:17  T:  05/19/2005 19:23:47  Job #:  [id number]   cc:   Olene Craven, M.D.  Fax: [phone]

## 2010-06-15 NOTE — Procedures (Signed)
NAMEALICK, LECOMTE               ACCOUNT NO.:  [id number]   MEDICAL RECORD NO.:  [id number]          PATIENT TYPE:  REC   LOCATION:  TPC                          FACILITY:  [facility]   PHYSICIAN:  Celene Kras, MD        DATE OF BIRTH:  1960/10/30   DATE OF PROCEDURE:  08/06/2005  DATE OF DISCHARGE:                                 OPERATIVE REPORT   [facility].  I evaluated  him, reviewed health and history form, 14 point review of systems, reviewed  the chart, progress to date and examined the patient.   To minimize escalation of controlled substances, improve function and  quality of life indices, it is reasonable to go on to thoracic facet  injection.  He describes a mechanical pain, particularly left greater than  right, but bilateral pain in nature.  He is not sure the exact mechanism,  but this has escalated to the point where he has had significant functional  impairment.  He has been evaluated and had thoracic epidurals throughout the  past couple of years, notably very little effect from thoracic epidural.  Rationale for thoracic facet injection is positive provocative experience,  may lead [place] to consideration of radiofrequency neural ablation.   We will follow him along expectantly, modifiable features and health profile  discussed.  Another issue is he is drinking alcohol, cautions as to mixing  medications and drinking, and he is possibly describing pain consistent with  neuropathy.  Further complicating the picture is that we note that he has  evidence of congestive heart failure and cardiomyopathy, and he is seeing  primary care in this regard.  Two risk factors include hypertension and  alcohol, both he states are being evaluated.   Objectively, diffuse perithoracic myofascial discomfort, __________ test  positive, side bending positive, range of motion impaired secondary to pain.  He has no new neurological findings.   Exam otherwise unremarkable.   IMPRESSION:  Facet syndrome, degenerative spinal disease, thoracic spine.   PLAN:  Thoracic facet injection, follow diagnostic and therapeutic,  predicate further injection based on need.  He is consented.   The patient taken to the fluoroscopy suite and placed in the prone position.  Back prepped and draped in the usual fashion.  Using a 25-gauge needle, I  advanced the thoracic facet at T7, 8 and 9 with contributory innervation  addressed.  This seems to be most problematic levels.  Right and left side  under local anesthetic.  Confirmed placement.  Used multiple fluoroscopic  positions.  I then inject 1 mL of lidocaine 1% MPF at each level with a  total of 40 mg of Aristocort in divided dose.   Tolerated procedure well.  No complications from our procedure.  Appropriate  recovery.  No complications identified. Will see him in follow-up.           ______________________________  Celene Kras, MD     HH/MEDQ  D:  08/06/2005 14:04:30  T:  08/06/2005 14:47:48  Job:  [id number]

## 2010-06-15 NOTE — Op Note (Signed)
Joshua Campbell, Joshua Campbell                           ACCOUNT NO.:  [id number]   MEDICAL RECORD NO.:  [id number]                   PATIENT TYPE:  INP   LOCATION:  3711                                 FACILITY:  [facility]   PHYSICIAN:  Anselmo Rod, M.D.               DATE OF BIRTH:  [birth year]   DATE OF PROCEDURE:  05/13/2002  DATE OF DISCHARGE:  05/13/2002                                 OPERATIVE REPORT   PROCEDURE:  Colonoscopy with snare polypectomy x3.   ENDOSCOPIST:  Anselmo Rod, M.D.   INSTRUMENT USED:  Olympus video colonoscope.   INDICATION FOR PROCEDURE:  A 50 year old white male with a history of rectal  bleeding and a family history of colon cancer in his mother, who was  diagnosed at 39 and died the same year.  Rule out colonic polyps, masses,  hemorrhoids, etc.   PREPROCEDURE PREPARATION:  Informed consent was procured from the patient.  The patient was fasted for eight hours prior to the procedure and prepped  with a bottle of magnesium citrate and a gallon of GoLYTELY the night prior  to the procedure.   PREPROCEDURE PHYSICAL:  VITAL SIGNS:  The patient had stable vital signs.  NECK:  Supple.  CHEST:  Clear to auscultation.  S1, S2 regular.  ABDOMEN:  Soft with normal bowel sounds.   DESCRIPTION OF PROCEDURE:  The patient was placed in the left lateral  decubitus position and sedated with 75 mg of Demerol and 10 mg of Versed  intravenously.  Once the patient was adequately sedate and maintained on low-  flow oxygen and continuous cardiac monitoring, the Olympus video colonoscope  was advanced from the rectum to the cecum without difficulty.  The patient  had a fairly good prep.  Small external hemorrhoid was seen on anal  inspection and small internal hemorrhoids on retroflexion in the rectum.  A  5-6 mm broad-based polyp was snared from 4 cm after injecting the base with  3 mL of epinephrine.  Another 2-3 mm polyp was snared from about 10 cm and  placed with a  larger polyp snared from 12 cm, which measured about 6 mm as  well.  These polyps were all sessile in nature.  There were a few scattered  diverticula throughout the colon.  No other large masses or polyps were seen  in the proximal left colon, transverse colon, right colon, or cecum.  The  terminal ileum appeared normal and without lesions.   IMPRESSION:  1. Small, nonbleeding internal and external hemorrhoids.  2. Three polyps removed from 4-12 cm (see description above).  3. Scattered diverticulosis.  4. Normal-appearing terminal ileum.   RECOMMENDATIONS:  1. Await pathology results.  2.     Anusol-HC 2.5% suppositories for hemorrhoidal discomfort.  3. Avoid all nonsteroidals, including aspirin, for now.  4. Outpatient follow-up in the next seven to 10 days.  Anselmo Rod, M.D.    JNM/MEDQ  D:  05/14/2002  T:  05/14/2002  Job:  [id number]   cc:   Olene Craven, M.D.  [address]  [address]  [place]  [place] [address]  Fax: [phone]

## 2010-06-15 NOTE — Op Note (Signed)
Joshua Campbell, Joshua Campbell                           ACCOUNT NO.:  [id number]   MEDICAL RECORD NO.:  [id number]                   PATIENT TYPE:  INP   LOCATION:  3711                                 FACILITY:  [facility]   PHYSICIAN:  Anselmo Rod, M.D.               DATE OF BIRTH:  1960/06/22   DATE OF PROCEDURE:  05/10/2002  DATE OF DISCHARGE:                                 OPERATIVE REPORT   PROCEDURE:  Esophagogastroduodenoscopy with biopsies.   ENDOSCOPIST:  Charna Elizabeth, M.D.   INSTRUMENT USED:  Olympus video panendoscope.   INDICATIONS FOR PROCEDURE:  A 50 year old white male with a history of  rectal bleeding and severe anemia.  Hemoglobin down to 6.7 g/dl.  Received  four units of packed red blood cells. Hemoglobin 10 g/dl today.  Undergoing  an EGD to rule out peptic ulcer disease, gastric AVM, esophagitis, etc.   PREPROCEDURE PREPARATION:  Informed consent was procured from the patient.  The patient was fasted for eight hours prior to the procedure.   PREPROCEDURE PHYSICAL:  VITAL SIGNS:  The patient had stable vital signs.  NECK:  Supple.  CHEST:  Clear to auscultation.  CARDIAC:  S1 and S2 regular.  ABDOMEN:  Soft with normal bowel sounds.   DESCRIPTION OF PROCEDURE:  The patient was placed in the left lateral  decubitus position and sedated with 100 mg of Demerol and 10 mg of Versed  intravenously.  Once the patient was adequately sedated, maintained on low  flow oxygen and continuous cardiac monitoring, the Olympus video  panendoscope was advance through the mouth piece over the tongue, into the  esophagus under direct vision.  The entire esophagus appeared normal with no  evidence of rings, stricture, masses, esophagitis or Barrett's mucosa.  The  scope was then advanced into the stomach.  Multiple antral erosions were  seen.  Biopsies were done to rule out presence of H pylori by pathology.  The rest of the gastric mucosa including the high cardia and the mid body  of  the stomach appeared normal.  Retroflexion revealed no abnormalities.  The  duodenal bulb and proximal small bowel distal to the bulb to 60 cm appeared  normal.   IMPRESSION:  Multiple antral erosions, biopsies done for Helicobacter  pylori; otherwise unrevealing esophagogastroduodenoscopy.   RECOMMENDATIONS:  1. Continue PPIs for now.  2. Continue serial CBCs.  3.     Treat with antibiotics if H pylori present on biopsies.  4. Colonoscopy in the a.m. considering the patient's severe anemia, family     history of colon cancer in his mother and his history of rectal bleeding     for the last year.  Anselmo Rod, M.D.    JNM/MEDQ  D:  05/12/2002  T:  05/13/2002  Job:  [id number]   cc:   Olene Craven, M.D.  [address]  [address]  [place]  [place] [address]  Fax: [phone]   [place], M.D.

## 2010-06-15 NOTE — Assessment & Plan Note (Signed)
Joshua Campbell is back regarding his thoracic back pain. I had a chance to review  his films in the office today, and they were consistent with the reading I  have with essentially the most significant issue the T8-T9 central disk  herniation that presses upon the cord but does not displace or light up the  cord. The patient started the Duragesic patches with fairly good results. He  has been able to tolerate these well. His pain has dropped to a 5 to 6/10.  Pain is worse with sitting or walking for long periods of time, still. He  was able to tolerate the Ultracet secondary to nausea, and he is no longer  taking this. The patient continues to use Flexeril on a p.r.n. basis mostly  at night time. The medication helps him sleep. The patient denies any  weakness in the arms or legs. He has had no numbness in the arms or legs,  nor any bowel or bladder dysfunction.   REVIEW OF SYSTEMS:  On review of systems, the patient denies any chest pain,  shortness of breath, cold or flu symptoms. She has had no wheezing or  coughing. The patient denies any seizures, weakness, or numbness. He has  occasional spasms. Denies stroke, vertigo, or confusion. Sleep is a problem  at times. Denies any hearing issues or headaches. He has had no diarrhea or  constipation. Denies fever, weight loss, or weight gain. Other pertinent  positives are listed above.   PHYSICAL EXAMINATION:  Blood pressure 129/84, pulse 107, respiratory rate  16. He is saturating 99% on room air.  The patient walks with a normal gait.  He is alert and appropriate in appearance.  The patient continues to have  normal motor and sensory exam in all four extremities. Lumbar range of  motion was fair. He had minimal pain with neck range of motion today. On  examination of the mid back posture, he continued to have some lean towards  the right with possibly some thoracic levoscoliosis of 1 to 2 degrees. There  was some elevation of the left scapula  in relation to the right side. There  is minimal rotation of thoracic spine as well to the right. Palpational  tenderness along the T6, T7, T8 spinous processes. Pain with difficulty  palpating the facets. Extension possibly caused more pain although he had  some discomfort with forward flexion of the thoracic spine today.   ASSESSMENT:  1. Thoracic spine pain which is probably diskogenic and to a certain extent     musculoskeletal in relation to the facet and thoracic paraspinals.  2. History of cervical laminectomy and fusion.  3. History of lumbar diskectomy and fusion.   PLAN:  1. I would like the patient to go to outpatient therapy to work on some     postoral techniques and stretches to see we can straightened out his     thoracic spine and shoulder positioning.  2. We will increase his fentanyl patch to 50 mcg q.72h. He will take two of     his 25-mcg patches until they are complete, then move up to the 50-cmg     patch.  3. I filled out paperwork for the patient today in regards to his     disability, and the patient is no longer able to perform work at the     level was previously operating at. 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  4. I will see the patient back in about four to six weeks' time.      Ranelle Oyster, M.D.   ZTS/MedQ  D:  06/20/2003 11:36:59  T:  06/20/2003 13:08:00  Job #:  [id number]   cc:   Olene Craven, M.D.  [address]  [address]  [place]  [place] [address]  Fax: [phone]

## 2010-06-15 NOTE — Procedures (Signed)
NAMEDAMYN, WEITZEL               ACCOUNT NO.:  [id number]   MEDICAL RECORD NO.:  [id number]          PATIENT TYPE:  REC   LOCATION:  TPC                          FACILITY:  [facility]   PHYSICIAN:  Celene Kras, MD        DATE OF BIRTH:  08/07/1960   DATE OF PROCEDURE:  09/10/2005  DATE OF DISCHARGE:                                 OPERATIVE REPORT   PATIENT:  Joshua Campbell.   DATE OF BIRTH:  01-25-1961.   SURGEON:  Celene Kras, MD   [name] and I evaluated  him. I have reviewed the Health and history form. I reviewed the 14 point  review of systems.   A number of issues are discussed.  1. He is not to mix alcohol, and pain meds, I am pretty firm on that,      cautions were given.  This is a significant risk item.  He agrees.  He      will maintain contact with Dr. Riley Kill.  2. He is to be reinjected today as he obtained benefit.  He had about a 2-      3 week of relief cycling, not complete, but probably at least 25-50%.      This is a reasonable estimation as to where we would be with RF.  We      may need to move forward with further imaging at some point.  3. Other modifiable features in health profile, such as weight control and      home based therapy are recommended.  I have reviewed the FCE, and I      imagine he can return to some type of gainful employment, he has many      self-limiting features, and I would like him to consider some type of      vocational retraining, certainly with a light medium which could      advance to medium category.  He should be able to move forward with      some type of gainful employment, considering his age and that there are      modifiable features in health profile.   Objectively pain over PSIS is notable, pain on extension is typical  myofascial lumbar pain, parathoracic myofascial pain, most notably side  bending, flexion/extension, most of notably T4-T6.  No new neurological  findings.   IMPRESSION:  Facet syndrome of thoracic spine __________ lumbar spine.   PLAN:  1. Thoracic facet medial branch injection, T4, 5, 6, right and left side,      independent needle access points under local anesthetic, contributory      innervation addressed.  Predicate further injection based on need and      overall response.  Contemplate RF.  2. Modifiable features in health profile as noted.  3. Consider further imaging, he will maintain contact with primary care as      well for well health.   The patient was taken to fluoroscopy suite and placed in the prone position.  Back prepped and  draped in usual fashion.  Using a 25-gauge needle, I  advanced to thoracic facet T4, 5, 6, right and left side, independent needle  access points, under local anesthetic.  Confirmed placement.  Then inject 1  mL lidocaine 1% MPF at each level with a total 40 mg Aristocort in divided  dose.   Tolerated procedure well.  No complications from procedure.  Appropriate  coverage.  Discharge instructions given.           ______________________________  Celene Kras, MD     HH/MEDQ  D:  09/10/2005 12:22:43  T:  09/10/2005 16:25:41  Job:  [id number]

## 2010-06-15 NOTE — H&P (Signed)
[employer] ([facility])  Patient:    Joshua Campbell, Joshua Campbell Visit Number: [id number] MRN: [id number]          Service Type: SUR Location: 3000 3021 [id number] Attending Physician:  Danella Penton Dictated by:   Tanya Nones. Jeral Fruit, M.D. Admit Date:  10/31/2000                           History and Physical  HISTORY OF PRESENT ILLNESS:  Mr. Stingley is a gentleman who was seen by me because of neck pain with radiation to the right shoulder and to the right upper extremity associated with numbness and tingling sensation.  Patient also had been complaining of thoracic pain.  The patient had had conservative treatment and he was not getting better.  The x-rays showed that indeed he has a cervical spondylosis at the level of 5-6 with a herniated disk.  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  Nevertheless, ______ right now and is being admitted for surgery.  ALLERGIES:  The patient is allergic to CODEINE and TYLOX.  SOCIAL HISTORY:  Patient smokes and he drinks socially.  PAST MEDICAL HISTORY:  Patient had a herniated disk in the lumbar spine taken care of by Dr. Alanson Aly. Robinson back in 1994.  FAMILY HISTORY:  Mother died when she was 58 with colon cancer and father has a history of cardiovascular disease.  PHYSICAL EXAMINATION:  HEENT:  Normal.  NECK:  He is able to flex but extension and lateralization causes a discomfort in the neck.  LUNGS:  Clear.  HEART:  Heart sounds normal.  ABDOMEN:  Normal.  EXTREMITIES:  Normal pulses.  NEUROLOGIC:  Mental status normal.  Cranial nerves normal.  Strength:  He has weakness of the biceps and wrist extensor with a decrease of the right biceps reflexes but normal triceps.  The left upper extremity is normal.  Sensation: He complains of some tingling sensation in the fingers.  Coordination and gait normal.  MUSCULOSKELETAL:  He complains  of some tenderness mostly at the level of thoracic 2, 3 and 4.  X-RAY FINDINGS:  The x-rays show that indeed he has a herniated disk with spondylosis at the level of 5-6.  CLINICAL IMPRESSION:  C5-6 herniated disk.  RECOMMENDATION:  The patient is being admitted for anterior cervical diskectomy.  The risks associated with the surgery would be failure of the plate, need for further surgery, infection, damage to the vocal cords, damage to the esophagus and need for further surgery. Dictated by:   Tanya Nones. Jeral Fruit, M.D. Attending Physician:  Danella Penton DD:  10/31/00 TD:  10/31/00 Job: [id number]

## 2010-06-15 NOTE — Procedures (Signed)
NAMEAMAY, MIJANGOS               ACCOUNT NO.:  [id number]   MEDICAL RECORD NO.:  [id number]          PATIENT TYPE:  REC   LOCATION:  TPC                          FACILITY:  [facility]   PHYSICIAN:  Celene Kras, MD        DATE OF BIRTH:  [birth year]   DATE OF PROCEDURE:  11/12/2005  DATE OF DISCHARGE:                                 OPERATIVE REPORT   [facility]  I evaluated  him, reviewed health and history form and 14 point review of systems.   1. Joshua Campbell benefited from thoracic facet injection, particularly at 5 and      6.  I think 4 is contributory, right and left side, with predominant to      the right side.  Left side still benefiting.  I think it is reasonable      to go on to RF and I reviewed the risks, complications and options.      This includes bleeding, infection, nerve damage, stroke, seizure,      death, other unforeseen problems not commonly encountered,      idiosyncratic reaction to medication, and hemothorax.  He accepts.  2. He is apparently applying for disability, I would like to see him      enabled if possible, perhaps this intervention will help.  3. Another rationale for performing procedure is to minimize escalation of      controlled substances.  Dr. Riley Kill is following this  4. I plan RF today, to the right side most problematic levels and follow      him expectantly. I do not think we will have to go to the left side,      will see how he does.  He is consented.  This will be with a 10 mm      active tip.   IMPRESSION:  Degenerative spinal disease thoracic spine with facet syndrome,  degenerative spinal disease cervical spine.   PLAN:  Thoracic facet radiofrequency neural ablation, T5-T6 contributory  innervation addressed.  Right side under local anesthetic 10 mm active tip.   The patient taken to the fluoroscopy suite and placed in the prone position.  Back prepped and draped in the usual fashion.   Using a 22-gauge RF needle I  advance to the facet at the medial branch T5-T6 with contributory  innervation addressed, right side under local anesthetic and confirmed  placement in multiple fluoroscopic positions.  Cautions as to the lung  tissue.  We appropriately stimulate.  We then follow with 1 mL lidocaine 1%  MPF at each level with a total of 20 mg Aristocort to each level.  We then  reconfirm needle placement and lesion at 60 degrees for 60 seconds at each  level.   Tolerated this procedure well.  No complications from our procedure.  Will  see him in follow-up in about 2 months.  Follow-up with Dr. Riley Kill.  I do  not think we will need to the contralateral side.  Will follow him along.  Discharge instructions reviewed.  No  barrier to communication.          ______________________________  Celene Kras, MD    HH/MEDQ  D:  11/12/2005 12:25:20  T:  11/13/2005 14:17:18  Job:  [id number]

## 2010-06-15 NOTE — Assessment & Plan Note (Signed)
DATE OF VISIT:  April 03, 2004   MEDICAL RECORD NUMBER:  [id number]   REASON FOR VISIT:  Joshua Campbell is here for followup of his back pain.  He has  continued to have thoracic level pain at the T5-T6 level.  He has had  temporary responses to trigger point injections to the paraspinals there.  His pain rates on an average to 8-9/10.  He still feels the pain was  exacerbated by the FCE that he performed in December.  Heat gives some  relief as well.  The Avinza helps make the pain somewhat tolerable.  He did  not find that the Lidoderm patches helped in any way.  The pain is worse  with foot turning and twisting of the torso.  It also hurts with bending.  He has difficulty driving his car and doing such things as backing up the  car.   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   REVIEW OF SYSTEMS:  NEUROLOGIC:  Numbness in bilateral upper extremities,  spasms, anxiety, problems with sleep, agitation, swelling in legs  occasionally.  Other pertinent positives are listed above.  Full review of  systems in the Health and History portion of the chart.   PHYSICAL EXAMINATION:  VITAL SIGNS:  Blood pressure 137/91, pulse 93,  respirations 16, saturations 97% on room air.  GENERAL:  The patient walks with a slight limp to the right side, but this  is minimal.  He is alert and appropriate.  He is alert, appropriate and  appearance is well-kept.  He continues to have some taught muscle throughout  the right side from T4-T6 to T7.  These areas are tight with palpation.  There is actually deeper pain near the rib insertion.  Posture favors the  right side as the Trapezius muscle appears somewhat contracted.  He has a  mild levoscoliosis of the thoracocervical spine also.  Motor and sensory  exam are normal in both upper extremities.  Lumbar movement  is fair.  The  patient had the most pain with thoracic movement when he tried to bend to  the left.  HEART:  Regular rate and rhythm.  LUNGS:  Clear.   ASSESSMENT:  1.  Thoracic spine pain due to thoracic spondylosis and degenerative disc      disease with the postural components as well.  2.  Question whether there is a costochondritis at the T7 level.  3.  History of cervical lumbar diskectomy infusion.   PLAN:  1.  We tried to inject the rib insertion today at T6 or T7 with 2 cc of 1%      Lidocaine and 40 mg of Kenalog.  The patient felt that the pain was      beginning to decrease already when he left the office today.  2.  Will continue with Avinza 30 mg q.12h. for pain control.  3.  The patient needs to improve upon his posture.  4.  Will try Zanaflex 2 mg q.h.s. titrating up to t.i.d. over 8 days time      for spasm.  Will taper some off.  5.  Continue Elavil for sleep.      ZTS/MedQ  D:  04/03/2004 14:14:50  T:  04/03/2004 15:06:21  Job #:  [id number]   cc:   Olene Craven, M.D.  [place] [id number]  Fax: [phone]

## 2010-06-15 NOTE — Assessment & Plan Note (Signed)
MEDICAL RECORD NUMBER:  [id number]   HISTORY OF PRESENT ILLNESS:  Joshua Campbell is back regarding his low back pain.  After last visit we sent him for a functional capacity evaluation to  determine his capacity levels.  Per the report, Joshua Campbell demonstrated an  organic pain response at 24.2 pounds which is consistent with his subjective  reports.  This is felt to represent his safe occasional to frequent work  ability today with his maximum safe tolerable force at 38.7 pounds and being  his safe infrequent work ability as well.  These results were considered  valid.  The testing was performed by Joshua Campbell PT at [facility].  The patient states that his pain had been fairly at  baseline.  He did have an exacerbation after the exam which seems to have  flared up his pain, particularly in the thoracic spine region.  He does  report some dysesthesias in the right leg.  His Avinza remains at 30 mg  b.i.d.  He does complain of itching with this sometimes, at night more than  the day.  Elavil does help him sleep to a certain extent.  He uses Flexeril  for breakthrough spasms 10 mg t.i.d.  The patient rates his pain as an 8/10  on average.  It improves with rest and medications, made worse with working  and therapy.  It continues to affect many of his quality of life indices.   SOCIAL HISTORY:  The patient is attempting to apply for long term  disability.  Apparently, he has been offered sedentary work through  [employer] but does not want to work at an hourly wage job without any  benefits.  The patient is only 50 years old.   REVIEW OF SYSTEMS:  The patient reports problems with sleep still as well as  occasional reflux, heartburn, high blood sugars.  Denies any diarrhea,  constipation, weight changes or swelling.   PHYSICAL EXAMINATION:  VITAL SIGNS:  Blood pressure 161/91, pulse 97,  respiratory rate 16, saturating 98% on room air.  GENERAL APPEARANCE:  Gait is  fairly normal.  It may be a bit slightly wide  based.  Affect is bright and alert.  Appearance is well kept.  NEUROLOGICAL:  Low back remains hyperlordotic.  There is old lumbar wound  noted.  There is some spasm of the paraspinals, particularly at the T4-T5  levels on the right side.  Muscle tone was much less on the left side today.  The patient had some difficulties with rotation of the thoracic spine.  Flexion was fair at the lumbar spine today.  Lateral bending of the torso  was fair on either side.  The patient is intact to motor and sensory exam of  the upper extremities.  Lower extremity exam was stable as well.  The  patient had fair posture overall.   ASSESSMENT:  1.  Thoracic spine pain due to generalized thoracic spondylosis and      degenerative disk disease with a secondary myofascial component.  2.  History of cervical lumbar diskectomy infusion surgeries.   PLAN:  1.  After informed consent, we injected the two thoracic trigger points at      T4-T5, each with 2 cc of 1% lidocaine.  Patient tolerated this well.  2.  Continue with Avinza at 30 mg q.12h.  I recommended he try Benadryl 25      mg q.6h. p.r.n. itching, if he in fact continues to have this.  3.  He will stick with the Elavil 25 mg q.h.s. as well as the Flexeril for      spasms 10 mg t.i.d. p.r.n.  4.  Will see the patient back in 3 month's time.  I encouraged him to seek      some finality regarding his work related      issues.  I would like to see him return to work, but certainly would      prefer he would find work with some type of future or advancement      possibility.      Zach   ZTS/MedQ  D:  01/11/2004 10:38:30  T:  01/11/2004 11:35:30  Job #:  [id number]   cc:   Olene Craven, M.D.  [address]  [address]  [place]  [place] [address]  Fax: [phone]
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
## 2010-06-15 NOTE — Assessment & Plan Note (Signed)
Joshua Campbell is back regarding his back pain.  The patient has not had any real  changes since I saw him last.  He passed on his thoracic facet injections we  had recommended, as he has been hesitant to go through these.  He seems to  be reconsidering.  Back pain is stable with pain most prominent in the mid  thoracic area as well as the right hip and buttock areas into the lower leg.  The prior MRI of the lumbar spine revealed a left hemilaminectomy defect at  L5 S1 with a disk bulge at that level and left para-midline protrusion.  There was some narrowing of the lateral foramen.  Avinza seems to have  helped a bit with his pain control.  His pain ranges from a 7-8/10.  Flexeril seems to be the only thing that helps with his spasms.  Sleep is  poor.  Pain is a bit dull, constant, aching.   On review of systems, patient reports numbness, spasm, tingling.  Other  pertinent positives listed above.  Full review is in the Health and History  Section.   SOCIAL HISTORY:  Without change.   PHYSICAL EXAM:  Blood pressure is 129/70, pulse 114, respiratory rate is 16,  he is sating 100% on room air.  The patient is pleasant, in no acute  distress.  He is alert and oriented x3.  Affect is bright and appropriate.  Gait is stable.  Back is tender to palpation in the mid thoracic area right  greater than left.  Extension increases pain as well as twisting.  Reflexes  are 1+ in both lower extremities.  Sensation seems a bit decreased in the  periphery, but inconsistently so.  Straight leg testing was negative.  Low  back exam was stable.  Motor function was 5/5 in all four extremities.   ASSESSMENT:  1.  Thoracic back pain related to degenerative disk disease and questionable      facet arthropathy.  Affected levels appear to be T4, 5 and 6.  2.  History of cervical lumbar diskectomy and fusion with facet arthropathy      in the lumbar spine and questionable L5 S1 impingement.  3.  Questionable  peripheral neuropathy related to alcohol use.   PLAN:  1.  Will send patient again for facet blocks at T4, T5 and T6.  2.  Continue Avinza 60 mg two q.12 hours and Flexeril 10 mg q.8h. p.r.n. for      spasm.  3.  Consider surgical opinion regarding thoracic spine.  4.  Consider an L5 S1 transforaminal injection on the right.  5.  Will see the patient back after he completes injections with Dr. Stevphen Rochester.      Ranelle Oyster, M.D.  Electronically Signed     ZTS/MedQ  D:  06/14/2005 11:29:43  T:  06/14/2005 17:18:01  Job #:  [id number]

## 2010-06-15 NOTE — Op Note (Signed)
[place]  Patient:    Joshua Campbell, Joshua Campbell Visit Number: [id number] MRN: [id number]          Service Type: SUR Location: 3000 [id number] [id number] Attending Physician:  Danella Penton Dictated by:   Tanya Nones. Jeral Fruit, M.D. Proc. Date: 10/31/00 Admit Date:  10/31/2000                             Operative Report  PREOPERATIVE DIAGNOSIS:  C5-6 herniated disk.  POSTOPERATIVE DIAGNOSIS:  C5-6 herniated disk.  PROCEDURES:  Anterior C5-6 diskectomy, decompression of the spinal cord, foraminotomy, interbody fusion with bone graft and a plate.  Microscope.  SURGEON:  Tanya Nones. Jeral Fruit, M.D.  ASSISTANT:  Hewitt Shorts, M.D.  CLINICAL HISTORY:  Mr. Nyce is a 50 year old gentleman complaining of neck pain with radiation to the right upper extremity, which has not been any better with conservative treatment.  MRI shows a herniated disk at C5-6. Surgery was advised.  The risk was explained in the history and physical.  DESCRIPTION OF PROCEDURE:  The patient was taken to the OR and after intubation, the left side of the neck was prepped with Betadine.  A transverse incision through the skin, platysma, and down to the cervical spine was done. X-ray showed that we were at the level of 5-6.  With the microscope we opened the anterior ligament, and we did a total gross diskectomy.  We found quite a bit of herniated disk going bilaterally, left worse than the right one.  With the drill, we drilled the end plates.  With the microscope we did a _____ foraminotomy, and removal of the posterior ligament was achieved.  Having a good decompression, bone graft of 8 mm was inserted, followed by a plate using four screws.  Lateral _____ spine showed good position of the bone graft. From then on, the area was investigated.  Esophagus, trachea, and carotid were negative.  The area was irrigated with saline and closed with Vicryl and Steri-Strip.  The patient did  well. Dictated by:   Tanya Nones. Jeral Fruit, M.D. Attending Physician:  Danella Penton DD:  10/31/00 TD:  10/31/00 Job: [id number]

## 2010-06-15 NOTE — Discharge Summary (Signed)
Joshua Campbell, Joshua Campbell                           ACCOUNT NO.:  [id number]   MEDICAL RECORD NO.:  [id number]                   PATIENT TYPE:  INP   LOCATION:  3711                                 FACILITY:  [facility]   PHYSICIAN:  Olene Craven, M.D.            DATE OF BIRTH:  14-Sep-1960   DATE OF ADMISSION:  05/10/2002  DATE OF DISCHARGE:  05/13/2002                                 DISCHARGE SUMMARY   DISCHARGE DIAGNOSES:  1. Iron-deficiency anemia.  2. Antral gastritis.  3. Diverticulosis.  4. Colonic polyps and rectal polyps.  5. Rule out seizure.  6. Alcohol abuse.   PROCEDURES:  1. Esophagogastroduodenoscopy which showed antral erosions.  2. Colonoscopy which showed scant diverticulosis, colonic polyps and a     rectal polyps; pathology is pending.  3. Patient had an MRI which was negative.  4. Electroencephalogram, final report pending.   DISCHARGE MEDICATIONS:  1. Prevacid 30 mg p.o. daily.  2. Iron sulfate 325 mg p.o. b.i.d.  3. Librium taper 25 mg p.o. b.i.d. x2 day, 25 mg p.o. daily x2 days, a half     tablet p.o. daily x2 days and a quarter tablet p.o. daily x2 days.  4. Anusol suppository 2.5% one p.r. q.12h. p.r.n.  5. Multivitamin a day.  6. Lexapro 10 mg a half tablet p.o. daily.  7. Flexeril 10 mg p.o. t.i.d. p.r.n.  8. Ultracet 37.5/325 mg p.o. q.6h. p.r.n.   FOLLOWUP:  The patient is to follow up with Dr. Demetrios Isaacs Hertweck in one  week's time for repeat blood work.  He is to follow up with Dr. Anselmo Rod as previously directed.   HOSPITAL COURSE:  The patient was admitted on May 09, 2000 after  experiencing what may have been a possible seizure activity while at the  short-stay receiving blood for transfusion.  The patient had a longstanding  history of orthostasis and on-and-off lower GI bleed and was seen for the  first time in the primary care physician's office and noted to be extremely  pale.  Blood count was drawn and it came back at 6.7  and was set up for  outpatient transfusions.  While at short-stay, the patient developed some  nausea during the transfusion and had vomiting and during the vomiting,  there was a questionable seizure activity versus a vasovagal event with  clonic jerking.  The patient was admitted for further observation.  He  received a total of four units of blood while in the hospital.  He was  loaded with Dilantin for safety.  MRI and EEG were performed.  The patient  had no more seizure activity during the course of hospitalization.  Wife did  report that he has a longstanding history of passing out after vomiting.  He  was set up for inpatient EEG and colonoscopy with Dr. Loreta Ave, the results  above.  He is being discharged in  stable condition after receiving four  units of blood.  It is at this time ruled to not likely be seizure activity  and he will not  be sent home on Dilantin therapy at this time.  We will monitor the patient  and wait for the final results of the EEG.  If the patient does have future  seizure activity, we will place him on Dilantin.  The event was felt to be a  vasovagal event.  He is being discharged in stable condition, to follow up  as an outpatient on the results of his tests.                                               Olene Craven, M.D.    DEH/MEDQ  D:  05/13/2002  T:  05/13/2002  Job:  [id number]   cc:   Anselmo Rod, M.D.  [address]  [address]  [place]  [place] [address]  Fax: [phone]
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
## 2010-06-15 NOTE — Assessment & Plan Note (Signed)
Joshua Campbell is back regarding his back pain.  His low back has been acting up a  bit, particularly when he walks, but he is having more problems with the  thoracic pain with significant cramping with most any activities.  He rates  his pain at an 8/10.  He has had some temporary results with trigger point  injections in the past.  I have not seen him since November when we last did  a trigger point at T6.  He continues on Avinza 60 mg q.12h.  He is on  Flexeril 10 mg q.8h. for spasticity control, but this is not doing a great  job at the moment.  Sleep is a problem.  He does not like using the Lunesta  in general.  The patient describes his pain as constant, tingling, aching.  Pain interferes with general activity, relationships with others and  enjoyment of life on a severe level.  The patient does get pain into the  right leg when he stands and walks for more than five minutes at a time.   REVIEW OF SYSTEMS:  The patient reports numbness, tingling, spasms,  decreased gait, balance and endurance, labile sugars.  The patient denies  any gastrointestinal, urinary or cardiorespiratory complaints today.   SOCIAL HISTORY:  The patient is married and wife is supportive.  He still  drinks on a regular basis.   PHYSICAL EXAMINATION:  VITAL SIGNS:  Blood pressure is 148/68, pulse is 100,  respiratory rate 16, he is saturating 100% on room air.  GENERAL: The patient is pleasant, in no acute distress.  He is alert and  oriented 3.  Affect is bright and appropriate.  MUSCULOSKELETAL/NEUROLOGIC:  His gait is antalgic, favoring the right leg  somewhat today.  The patient has a notable cord of muscle from approximately  T4 to T6, which is painful with palpation.  It is somewhat worse with  stretching either with rotation of his shoulders or lifting of the shoulders  today.  The low back was tender along the laminectomy site.  The patient was  limited with flexion-extension of the neck and lumbar spine  today.  No  obvious radicular signs were noted.  Straight leg testing was equivocal.  CARDIAC:  Regular rate and rhythm.  LUNGS:  Clear.   ASSESSMENT:  1.  Thoracic back pain related to degenerative disk disease and myofascial      pain, particularly at T4 to T7 on the right.  2.  History of cervical, lumbar diskectomy and fusion.   PLAN:  1.  We injected along the T4, T5 and T6 levels using three separate      injection points with 2 mL of 1% lidocaine.  The patient tolerated this      well.  I think the patient would be an ideal candidate for Botox      injections as he has had positive temporary results with traditional      trigger point injections.  2.  Will begin the patient on scheduled Baclofen 10 mg b.i.d., titrating up      to t.i.d. over the next week.  He may use his Flexeril 10 mg q.8h.      p.r.n.  3.  Continue Avinza 60 mg q.12h.  4.  Add Elavil 10 mg q.h.s. for sleep, may repeat x1.  5.  I will see the patient back pending approval of Botox.      Ranelle Oyster, M.D.  Electronically Signed  ZTS/MedQ  D:  03/12/2005 13:49:10  T:  03/13/2005 07:14:31  Job #:  [id number]

## 2010-06-15 NOTE — Assessment & Plan Note (Signed)
Joshua Campbell is back regarding his back pain.  He has fair results with trigger  point injections that only seem to last three or four weeks at a time.  He  remains on Avinza at 60 mg q.12h. with fair results.  He did have a spell  where he inflamed his back.  He does not recall exactly what he did.  It may  have been a twisting or bending activity.  He rates his pain at 7/10 this  morning.  He describes the pain as burning, constant, tingling.  He settled  his Workers Comp case.  Sleep is fair.  Pain interferes with general  activity, relations with others and enjoyment of life on a moderate level.   REVIEW OF SYSTEMS:  The patient reports numbness, tingling, spasms.  He  denies any constitutional, GU, GI, cardiorespiratory problems.   PHYSICAL EXAMINATION:  VITAL SIGNS:  Blood pressure 127/80, pulse 103,  respiratory rate 16, saturating 98% on room air.  GENERAL:  He is pleasant, in no acute distress.  He is alert and oriented  x3.  Affect is bright and appropriate.  Appearance is well-kempt.  NEUROLOGIC/MUSCULOSKELETAL:  Gait stable.  He has tight paraspinal  musculature on the right all over the thoracic spine area.  Pain is greatest  in the T4-T5 area today.  Farther down at T7 he was less comfortable today.  Extension increased pain.  He has moderate pain with rotation.   ASSESSMENT:  1.  Thoracic pain due to thoracic spondylosis and degenerative disk disease      with postural and myofascial components.  May have facet arthropathy in      the mid- to upper thoracic spine at T4-5 or T5-6.  2.  History of cervical and lumbar diskectomy and fusion.   PLAN:  1.  After informed consent, we injected the right T4-5 trigger point with 2      mL of 1% lidocaine.  The patient tolerated this well.  2.  I encouraged him to follow through with the IDD therapy.  3.  Continue Avinza 60 mg q.12h.  4.  Encouraged stretching and range of motion as well as other modalities.  5.  See him back in two  months' time.  He will follow up with the R.N.'s      here in one month.       ZTS/MedQ  D:  08/06/2004 11:58:17  T:  08/06/2004 12:16:32  Job #:  [id number]

## 2010-06-15 NOTE — Group Therapy Note (Signed)
INITIAL EVALUATION:  MR# [id number]   CHIEF COMPLAINT:  Mid back pain.   HISTORY OF PRESENT ILLNESS:  This is a 50 year old white male who I had seen  in our other office approximately 3 years ago for ongoing neck pain.  We had  sent him back to Dr. Jeral Fruit for surgical evaluation and ultimately needed  cervical surgery.  He had a fusion in April, 2002.  The patient has done  fairly well in regards to the neck but has had new mid back infrascapular  pain.  He had an MRI of the thoracic spine approximately 2 years ago.  He  states that he has received epidural injections in the spine by a pain  clinic in [place].  He had no benefit with any of his injections.  He  is using Hydrocodone for breakthrough pain as well as Flexeril but does not  feel that he has had any benefit from these.  He has used Tylox and  methadone in the past which did not help him and he could not tolerate.  OxyContin made him sleepy.   The patient rates his pain as an 8/10.  He states that the pain is worse  with working and therapy.  He gets better with medication and has done  better with massage.  He has had no recent physical therapy. The patient  denies any radiation of pain into his arms or legs.  Often the pain is in  the midline just below the scapular area and radiating 4 inches or so to the  right and occasionally to the left.  The most significant pain however is  centrally.  He denies numbness, weakness in the arms or legs.   PAST MEDICAL HISTORY:  1. Lumbar laminectomy in 1996.  2. Cervical laminectomy and fusion in 2002.  3. He has had colon polyps.  4. Hypertension.   CURRENT MEDICATIONS:  1. Hydrochlorothiazide 25 mg one daily.  2. Norvasc 5 mg daily.  3. Lisinopril 10 mg daily.  4. Flexeril 5 mg t.i.d.  5. Aspirin 81 mg daily.  6. Hydrocodone 5/500 1 q.12h p.r.n.  7. Prevacid 30 mg daily.   ALLERGIES:  CODEINE AND TO MEDROL DOSEPAK.   SOCIAL HISTORY:  The patient is married.  He  quit smoking 3 years ago.  He  last worked in April, 2001.   FAMILY HISTORY:  Significant for heart disease and cancer.   REVIEW OF SYSTEMS:  The patient denies chest pain, shortness of breath, cold  or flu symptoms.  He has had no wheezing or coughing.  The patient denies  weakness, numbness, dizziness, spasms, anxiety, depression.  He does have  problems with sleep.  Denies any agitation, headaches.  Denies nausea,  vomiting, diarrhea, constipation, bowel or bladder incontinence.  He has had  some reflux.  He has had no recent weight changes or swelling.   PHYSICAL EXAMINATION:  The patient is pleasant, in no acute distress.  Blood  pressure is 131/ 72, pulse is 101, respiratory rate is 16, saturating 98% on  room air.  The patient walks with a minimal limp.  He is alert and normal in  appearance.   Lower extremity muscle exam is 5/5 on muscles tested.  He has good range of  motion.  Sensory function is intact.  Reflexes are 1+.  Upper extremity  motor exam is 5/5 also with normal range of motion, sensory function with 1+  reflexes noted throughout.  Lumbar range of motion is fair  without pain  provocation today.  Straight leg testing is negative.  On examination of the  neck he had fair neck range of motion, I did not provoke pain.  Spurling's  test was negative.   On examining the patient's posture, he did have imbalance with the shoulders  leaning towards the right.  There may be a slight lumbar thoracic  levoscoliosis of 1/2 degrees.  Seems to correct above the scapulae.  The  right scapula was about 1/2 to 1 inch below the left scapula.  There might  have been slight rotation of the thoracic spine clock-wise with the right  scapula sitting slightly posterior to the left.  He had pain with palpation  along the spinous processes of T6, T7, T8.  Less pain with palpation of the  facets.  Rotation was palpable with pressure upon the facets.  The patient  had a little more freedom  of movement to the left than he did to the right.  Pain was worse with flexion to the right.  He also had pain with extension  of the thoracic spine.   ASSESSMENT:  1. Thoracic spine pain which appears to be facet in nature most likely.     There is also a large postural component here with significant     asymmetries from his left to right thoracic spine and scapulae.  2. History of a cervical laminectomy and fusion.  3. History of lumbar diskectomy and fusion.   PLAN:  1. I need to obtain the most recent MRI of the thoracic spine.  We will call     Dr. Cassandria Santee office for this.  2. Will place the patient on Fentanyl 25 mcg q.72 hour patch.  Will see how     he tolerates this. We discussed side effects and application of the     patches today.  3. For breakthrough pain will try Ultracet 1 tablet q.6h p.r.n.  4. I would ultimately like to send the patient to outpatient therapies to     work on postural issues and alignment, however I would like to review his     films first.  5. I will see the patient back in approximately 1 month's time.  Cannot rule     out having facet injections performed on this patient, depending on     future clinical presentation and the findings of his MRIs.     Ranelle Oyster, M.D.   ZTS/MedQ  D:  05/16/2003 16:21:57  T:  05/17/2003 07:40:18  Job #:  [id number]   cc:   Olene Craven, M.D.  [address]  [address]  [place]  [place] [address]  Fax: [phone]

## 2010-06-15 NOTE — Assessment & Plan Note (Signed)
Joshua Campbell is back regarding his back pain.  He is stable from a pain  standpoint at a 7/10.  He has not gone for his facet block as of yet due to  family conflicts and problems.  He wishes to re-schedule this in the next  month or so.  He has had some intermittent pain to the right leg and  buttocks, although this is inconsistent and usually is positional.  He also  complains of some intermittent left sided scrotal and penile numbness while  in the car the other day.   He remains Avinza 60 mg q.12 h. and Flexeril 10 mg q.8 h. p.r.n.   The pain is at a 7/10 and described as burning, dull, constant, and tingling  in general.  The pain interferes with general activity, relations with  others, enjoyment of life on a moderate level.  Sleep is fair.   REVIEW OF SYSTEMS:  The patient reports numbness, tingling, spasms, easy  bleeding, improved blood sugars.  He has had occasional cough.   SOCIAL HISTORY:  The patient lives with his wife.  He still drinks a 6-pack  a day of beer.   PHYSICAL EXAMINATION:  VITAL SIGNS:  Blood pressure is 121/70, pulse is 111,  respiratory rate is 16.  He is sating 98% on room air.  GENERAL:  The patient is pleasant, no acute distress.  He is alert and  oriented x3.  Affect is bright and appropriate.  BACK:  Generally stable in appearance and range of motion today.  He has  some tenderness along the mid thoracic region, right greater than left.  It  increases with extension and rotation of the trunk.  Motor and sensory  function within normal limits.  Straight leg raise testing was negative.  Low back exam was without significant change today.   ASSESSMENT:  1.  Thoracic back pain related to degenerative joint disease and      questionable facet arthropathy at levels T4-T6, right greater than left.  2.  History of lumbar and cervical diskectomy and fusion with facet      arthropathy, questionable L5-S1 impingement on the left.  3.  History of alcohol use.   PLAN:  1.  The patient is awaiting facet blocks at T4, T5, and T6 bilaterally with      Dr. Stevphen Rochester as an appointment is available.  2.  Continue Avinza 60 mg q.12 h.  This was refilled today, #60.  3.  Also we will continue Flexeril 10 mg q.8 h. p.r.n. for spasm.  4.  Consider L5-S1 transforaminal injection versus interlaminar injection.  5.  I will see the patient back pending injections with Dr. Stevphen Rochester.      Ranelle Oyster, M.D.  Electronically Signed     ZTS/MedQ  D:  07/12/2005 12:31:27  T:  07/12/2005 13:19:39  Job #:  [id number]

## 2010-06-15 NOTE — Assessment & Plan Note (Signed)
Audio too short to transcribe (less than 5 seconds)      Ranelle Oyster, M.D.   ZTS/MedQ  D:  05/16/2003 16:12:48  T:  05/16/2003 18:38:11  Job #:  [id number]

## 2010-06-15 NOTE — Assessment & Plan Note (Signed)
MEDICAL RECORD NUMBER:  [id number]   INTERVAL HISTORY:  Joshua Campbell is here in followup of his thoracic back pain.  He has done fairly well with the Avinza.  He says he is having a bad day  today with some more muscle spasms.  Sleep has been up and down.  He feels  that the increase in the Flexeril to 10 mg really has not helped his spasms  more and he is feeling more groggy in the morning as a result.  He continues  with his regular exercise program.  Patient rates his pain at a 5-6/10 on  average.  Pain is worse with bending and activities around the home.  Generally he is a little bit better with rest and medications.   REVIEW OF SYSTEMS:  On review of systems patient denies any chest pain,  shortness of breath, cold, flu, wheezing, or coughing symptoms.  Does report  some occasional numbness in the arms, cramping in the legs, and problems  with sleeping.  Denies nausea, vomiting, diarrhea, constipation, bladder or  bowel incontinence.  Denies fever and chills or weight loss.  Other review  of system items are noted in the health and history section of the chart.   PHYSICAL EXAMINATION:  On physical examination blood pressure is 146/86,  pulse 105, respiratory rate 16, he is saturating 97% on room air.  Patient  walks with a slight limp but generally stable.  Affect is bright and alert.  Appearance is well kept.  He has had some palpable bands of muscle along the  T6 level on the right side.  They are worse with palpation and with bending  to the right.  Forward flexion and extension posteriorly did not  significantly increase the pain.  Spinous processes were slightly tender in  this area as well.  He had some mild asymmetry on visual exam of the right  thoracic paraspinal muscles with the spasm noted.  Motor and sensory exam is  within normal limits in all four extremities today.  Shoulder and head  posture are reasonable today.   ASSESSMENT:  1.  Thoracic spine pain which has been  improved with the Avinza.  He appears      to have more muscular myofascial pain at this point.  His MRI of the      thoracic spine showed some generalized thoracic spondylosis and      degenerative disk disease which is essentially only mild in nature.  The      disk disease was most prominent at T7-T8 and T8-T9.  2.  History of cervical laminectomy and fusion.  3.  History of lumbar diskectomy and fusion.   PLAN:  1.  We will continue Avinza at 30 mg q.12h.  2.  We will decrease his Flexeril back to 5 mg q.8h. p.r.n.  Primarily the      patient is using this at nighttime only.  I did give him some samples of      Zanaflex 2 mg to try at bedtime p.r.n. in place of the Flexeril for now      to see if it gives him any benefit with his pain and spasm.  3.  After informed consent we injected the right paraspinal trigger point      today with 2 mL of 1% lidocaine.  The patient tolerated this well.  He      will continue with his home stretching and exercise program.  4.  For sleep and pain,  we initiated Elavil 10 mg at bedtime.  The patient      may repeat this x1 if needed.  I gave him      permission to take up to three at one time at bedtime depending on his      response.  5.  I will see the patient back in about 1 month's time.      Ranelle Oyster, M.D.   ZTS/MedQ  D:  10/10/2003 15:13:32  T:  10/10/2003 22:17:33  Job #:  [id number]   cc:   Olene Craven, M.D.  [address]  [address]  [place]  [place] [address]  Fax: [phone]

## 2010-06-15 NOTE — Assessment & Plan Note (Signed)
DATE OF VISIT:  October 08, 2004   MEDICAL RECORD NUMBER:  [id number]   Joshua Campbell is back regarding his back pain. He did have 2 to 4 weeks of results  with the trigger point injections we performed in the thoracic spine but  then the results waned after that. He has had some increased pain in the  right low back and buttock area with numbness into the legs. This seems to  restrict his walking and activity. He can walk about 5 minutes at a time  without stopping. The thoracic pain is more consistent regardless of  position or movement. Overall he rates his pain as a 7/10. He describes the  pain as spurting and dull. It interferes with general activity, relations  with others, and enjoyment of life on a moderate to severe level. Sleep is  poor. He generally falls to sleep at 10 p.m. or 11 p.m. and wakes up at 2  a.m. or 3 a.m. He remains on Avinza 60 mg q.12h. as well as Zanaflex 2 mg  t.i.d. p.r.n. He most often takes the Zanaflex at bedtime. He tries to stay  active with exercise and stretches but is not consistent with these.   REVIEW OF SYSTEMS:  The patient reports numbness, tingling, and trouble  walking. He does report some weakness and does complain of spasms. He denies  any constitutional, GU, GI, or cardiorespiratory complaints today.   SOCIAL HISTORY:  The patient is married without change.   PHYSICAL EXAMINATION:  Blood pressure is 100/68, pulse 95, respiratory rate  16, saturating 98% in room air. The patient is pleasant, no acute distress.  He is alert and oriented x3. Affect is bright and appropriate. Heart is  regular rate and rhythm, lungs are clear. Coordination is fair. Reflexes are  1+ throughout. Sensation is intact. He had a normal motor exam in all four  extremities today. Low back exam is fairly stable with some pain with end-  ranged twisting and extension of the lumbar spine. The thoracic spine was  unchanged with spasm in the paraspinal musculature at  T5-T7.   ASSESSMENT:  1.  Thoracic pain due to spondylosis with degenerative disc disease and      secondary myofascial pain.  2.  History of cervical and lumbar discectomy and fusion with increasing      right buttock pain and right lower extremity symptoms today.   PLAN:  1.  After informed consent we injected the T6 trigger point with 2mL of 1%      lidocaine. The patient tolerated this well.  2.  Continue with Avinza 60 mg q.12h.  3.  Consider MRI of the lumbar spine.  4.  Continue working on stretching and range of motion therapies. The      patient needs to walk and watch his weight.  5.  For sleep, will add Lunesta 2 mg p.o. q.h.s.  6.  Continue Zanaflex 2 mg t.i.d. for sleep and spasm.  7.  Will see the patient back in about 2 months' time. He will see the RN      clinic in 1 month.     Ranelle Oyster, M.D.  Electronically Signed    ZTS/MedQ  D:  10/08/2004 11:03:54  T:  10/08/2004 11:27:46  Job #:  [id number]

## 2010-06-15 NOTE — Assessment & Plan Note (Signed)
MEDICAL RECORD NUMBER:  [id number].   Joshua Campbell is here in followup of his thoracic back pain.  He has done a little  better with the Avinza.  He has dropped his pain from an 8-9/10 to a 6/10,  still depending on the amount of activity he does.  He does seem to worsen  during the day.  He was unable to tolerate the Duragesic patches.  Therapy  helped him a bit.  He states that he only findings one to two strength that  he does to be overly helpful at home.  He uses Flexeril 10 mg q.h.s. for  spasms generally at nighttime.  His daily exercise is minimal.  He does use  a massage machine at home, but this is inconsistent.   REVIEW OF SYSTEMS:  The patient denies problems with chest pain, shortness  of breath, cold, flu, wheezing, or coughing symptoms.  He denies seizures,  weakness, numbness, dizziness, confusion, and anxiety.  He does have some  difficulty with sleep, but denies agitation, headaches, nausea, and  vomiting.  He does complain of some reflux, diarrhea,and constipation.  He  had no real problems with the Avinza from a side effects standpoint.  The  patient denies fever, chills, weight changes, or skin problems.   PHYSICAL EXAMINATION:  On physical examination today, the blood pressure is  125/82, pulse 97, respiratory rate 20.  He is saturating 98% on room air.  The patient walks with a fairly normal gait and good posture for the most  part.  Affect is bright.  Appearance is normal.  The low back continues to  show some rotation of the thoracic spine in a clockwise manner with spasm of  the thoracic paraspinal musculature.  There is mild levoscoliosis of 2  degrees.  He has some elevation of the left scapula over the right.  He  continued to have some palpatory tenderness over the thoracic spinous  processes, but to a less extent than previously.  No focal trigger point was  palpated today.  The facets were generally painful to palpation.  Extension  and flexion some caused  discomfort, but not overly so.  The patient almost  touched his toes today.  Lateral bending seemed to cause discomfort in the  right thoracic region.  Motor and sensory exams within normal limits.  Skin  was intact.  Pulses were 2+.   ASSESSMENT:  1. Thoracic spine pain which continues to be a problem, although improved     with initiation of Avinza and physical therapy.  There remains a     discogenic and large musculoskeletal component to his pain.  Facets may     also be involved.  2. History of cervical laminectomy and fusion.  3. History of lumbar diskectomy and fusion.   PLAN:  1. Will increase Avinza to 30 mg q.12h.  2. Continue with Flexeril 10 mg q.h.s.  I would have no problem if he would     chose to go down to 5 mg.  3. Recommend aggressive aerobic exercise and stretching at home as directed     by physical therapy.  The patient has not been following through with     this.  4. Due to the persistent pain, would like to follow up an MRI of the     thoracic spine as this has not been done for     over two years' time.  5. I will see the patient back in about six weeks.  Ranelle Oyster, M.D.   ZTS/MedQ  D:  08/29/2003 13:48:29  T:  08/29/2003 15:16:08  Job #:  [id number]   cc:   Olene Craven, M.D.  [address]  [address]  [place]  [place] [address]  Fax: [phone]

## 2010-06-15 NOTE — Assessment & Plan Note (Signed)
MEDICAL RECORD NUMBER:  [id number]   DATE OF BIRTH:  02/04/1960   DATE OF VISIT:  Jun 08, 2004   INTERVAL HISTORY:  Joshua Campbell is back regarding Joshua on ongoing thoracic pain.  He  states that he had more benefit with the T7 trigger point injection last  time.  He says that he is making a bit of progress each time but still has a  good amount of pain.  The Avinza has been helpful at 60 mg q.12h.  He did  back off when he noticed some blood in Joshua stool.  He also has had some  itching to a certain extent which seems to be decreasing.  He has gone down  over the last day or two to once daily dosing.  Uses Zanaflex 2 mg at  nighttime to help sleep and with spasms.  He does not take it during the day  due to fatigue.  Joshua Campbell appear to still be up in the air at this  point.  Patient does note some low back pain.  He does try some stretching.  He finds heat somewhat effective  for the muscle area.  Pain rates on  average at a 6-8/10.  He describes it as constant and burning.  It  interferes with Joshua general activity, relationship with others and enjoyment  of life on a moderate to severe level.  Sleep is fair to poor.  Pain  improves with rest and injections that we have done.  Injection relief  period has ranged from a few days to a few weeks.   REVIEW OF SYSTEMS:  Patient reports spasms.  Denies any shortness of breath,  problems with mood or anxiety, fever, chills, weight changes.  Denies frank  constipation.  Other pertinent positives listed above.  Full review of  systems is in the health and history section of the chart.   SOCIAL HISTORY:  Patient tries to remain active around the home.  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   PHYSICAL EXAMINATION:  Blood pressure is 140/85, pulse is 94, respiratory  rate 16, he is saturating 100% in room air.  Patient is pleasant, no acute  distress.  He is alert and oriented x3.  Affect is bright and  appropriate.  Appearance is well kept.  Gait is normal.  Coordination, reflexes, sensory  exam and muscle exam within normal limits.  The heart was regular rate and  rhythm and lungs were clear.  Patient continues to have tightness today more  so in the right in the T5-T8 area.  He seemed to be most tight at  approximately T7.  Seemed to relax a bit with stretching of the scapular  muscles.  He tends to stand with a bit of a curved posture.  Lateral bending  and rotation and movement are unchanged from prior examination.  There is  mild levoscoliosis of the thoracic and cervical spine.   ASSESSMENT:  1.  Thoracic pain due to thoracic spondylosis and degenerative disk disease      with postural and myofascial components.  2.  History of cervical and lumbar discectomy and fusion.   PLAN:  1.  We injected the right T7 area with 2 mL trigger point injection      containing 1% lidocaine.  Patient tolerated this well.  2.  We discussed home stretching exercises, modalities, etc.  He would do      well with alternating  heat and ice to the area.  I would like for him to      work on stretching Joshua chest muscles as well as Joshua scapular stabilizing      muscles.  3.  Continue Avinza 60 mg q.12h.  We talked about dietary and other      medication additions that may be helpful in keeping Joshua stools regular.      Unless he is having constipation, I doubt there is any relation of the      Avinza to Joshua stool problems.  He does have a history of hemorrhoids.  4.  We will send the patient to IDD therapy to work on posture and thoracic      alignment.  Patient was unable to get      to that last visit due to Joshua other ongoing Campbell.  5.  I will see the patient back in 2 months' time.      ZTS/MedQ  D:  06/08/2004 12:01:31  T:  06/09/2004 17:14:22  Job #:  [id number]   cc:   Olene Craven, M.D.  [address]  [address]  [place]  [place] [address]  Fax: [phone]

## 2010-06-15 NOTE — Assessment & Plan Note (Signed)
Joshua Campbell is back regarding his thoracic pain.  He had good results with the  thoracic medial branch blocks, especially the day of the injection.  He has  had slowly worsening of the pain since.  He has had two sets of injections  with Dr. Stevphen Rochester.  Otherwise not much has changed since our last visit.  He  still on litigation regarding work issues and his disability.  The patient  asked me if he needed another __________  which I did not feel was  appropriate.  The patient reports pain at a 6-7/10.  Describes pain as  general aching and throbbing.  He has pain in the upper back as well as down  into the lower back.  The patient interferes with general activity,  relations with others and quality of life on a moderate level.   REVIEW OF SYSTEMS:  Without change.   MEDICATIONS:  1. He remains on avinza 60 mg q.12h..  2. Flexeril 10 mg q.8h. p.r.n.   SOCIAL HISTORY:  The patient lives with his wife.   PHYSICAL EXAMINATION:  VITAL SIGNS:  Blood pressure 120/70, pulse 80,  respiratory rate 16, saturation 98% on room air.  GENERAL:  The patient is pleasant and in no acute distress.  He is alert and  oriented x3.  Affect is bright and appropriate.  BACK:  Stable in appearance.  He has had some tenderness along the mid  thoracic region, right great the left.  He seems improved over the last  visit.  Seems to have less muscle spasm today.  Motor and sensory function  within normal limits.  Straight leg testing was negative today.  Low back  was stable.   ASSESSMENT:  1. Thoracic back pain related to likely facet  arthropathy.  2. Lumbar and cervical post laminectomy syndrome.  3. History of alcohol abuse.   PLAN:  1. I did send the patient back for thoracic facet radiofrequency blocks      per Dr. Stevphen Rochester.  I think he may do well with these.  2. Refill avinza  60 mg q.12h. #60 and Flexeril 10 mg q.8h. #90.  3. I will see the patient back pending procedure of Dr. Stevphen Rochester.      Ranelle Oyster, M.D.  Electronically Signed     ZTS/MedQ  D:  10/11/2005 16:32:18  T:  10/13/2005 11:53:17  Job #:  [id number]

## 2010-06-15 NOTE — Assessment & Plan Note (Signed)
MEDICAL RECORD NUMBER:  [id number]   DATE OF BIRTH:  26-Jul-1960   DATE OF VISIT:  May 02, 2004   INTERVAL HISTORY:  Joshua Campbell is here regarding his ongoing thoracic pain.  We  tried a costochondritic injection at T7 with lidocaine and Kenalog last  visit and he only had temporary results.  The patient's pain still remains  at an 8/10.  He did have temporary results lasting a week or two.  Pain  continues to interfere with activities of daily life, enjoyment and  relationship with others.  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  He describes his  pain as sharp and constant.  Pain is worse with twisting and turning as well  as bending in the mid back.  Pain is better with his medication and rest.  He feels that the Avinza takes the edge off his pain.  The Avinza at 30 mg  q.12h.  Zanaflex helps his sleep at 2 mg at bedtime but he is unable to  tolerate frequent doses during the day due to fatigue.   SOCIAL HISTORY:  There are multiple issues regarding his disability and  return to work.  He and his lawyer are trying to reach agreement with his  company.   REVIEW OF SYSTEMS:  Review of systems reveals spasms which are often  debilitating in the mid back with movement.  He also reports a poor appetite  and occasional diarrhea.  Other pertinent positives are listed above.   PHYSICAL EXAMINATION:  Blood pressure is 147/81, pulse is 93, respiratory  rate is 16, he is saturating 98% on room air.  The patient ambulates with a  slight but minimal limp on the right.  He is alert and appropriate and well  kept.  He continues to have some tautness in the musculature from T4-T7.  Today the left side is more tender.  I graded his range of motion with  lateral bending at 30 degrees to the left and right, rotation was 35 to the  left and 40 degrees to the right with significant pain, 20 degrees of  forward flexion was noted with  15 degrees of extension in the thoracic  spine.  He continues to have a mild levoscoliosis of the thoracic and  cervical spine as well.  Motor and sensory exam are normal in both upper  extremities with occasional pain inhibition.  Lumbar movement is fair.  Heart is regular rate and rhythm.  Lungs are clear.   ASSESSMENT:  1.  Thoracic spine pain likely due to thoracic spondylosis and degenerative      disk disease with postural components.  2.  History of cervical and lumbar diskectomy and fusion.   PLAN:  1.  We injected the left area of spasm and trigger point today at T7.  We      used 2 mL of 1% lidocaine.  The patient tolerated this well.  2.  I will increase his Avinza to 60 mg q.12h.  He may continue with      Zanaflex as needed for sleep and periodic spasm relief if he is able to      tolerate this.  3.  Continue Elavil to help with sleep and pain.  4.  We will send the patient to IDD therapy to see if we can work on posture      and thoracic alignment.  I have had  success with this in the past with      similar patients particularly in lumbar spine patients.  I am anxious to      see how he would do with this.  Patient was open to trying this      modality.  5.  We will provide the patient a disability rating as related to his      thoracic spine.  6.  I will see the patient back in 1 month's time.      ZTS/MedQ  D:  05/02/2004 12:29:18  T:  05/02/2004 13:35:05  Job #:  [id number]   cc:   Olene Craven, M.D.  [address]  [address]  [place]  [place] [address]  Fax: [phone]

## 2010-07-10 ENCOUNTER — Other Ambulatory Visit: Payer: Self-pay | Admitting: *Deleted

## 2010-07-10 MED ORDER — OXYCODONE HCL 5 MG PO TABS: 5.0000 mg | ORAL_TABLET | Freq: Two times a day (BID) | ORAL | Status: DC | PRN

## 2010-07-10 NOTE — Telephone Encounter (Signed)
Patient advised rx ready for pick up 

## 2010-07-10 NOTE — Telephone Encounter (Signed)
Pt is asking for 90 day supply to send to mail order.  Please call when ready.

## 2010-08-10 ENCOUNTER — Other Ambulatory Visit (INDEPENDENT_AMBULATORY_CARE_PROVIDER_SITE_OTHER): Payer: 59 | Admitting: Family Medicine

## 2010-08-10 DIAGNOSIS — E119 Type 2 diabetes mellitus without complications: Secondary | ICD-10-CM

## 2010-08-17 ENCOUNTER — Ambulatory Visit (INDEPENDENT_AMBULATORY_CARE_PROVIDER_SITE_OTHER): Payer: 59 | Admitting: Family Medicine

## 2010-08-17 ENCOUNTER — Encounter: Payer: Self-pay | Admitting: Family Medicine

## 2010-08-17 VITALS — BP 130/70 | HR 106 | Temp 98.3°F | Ht 70.0 in | Wt 229.8 lb

## 2010-08-17 DIAGNOSIS — E119 Type 2 diabetes mellitus without complications: Secondary | ICD-10-CM

## 2010-08-17 DIAGNOSIS — I10 Essential (primary) hypertension: Secondary | ICD-10-CM

## 2010-08-17 NOTE — Assessment & Plan Note (Signed)
Poor control off meds, restart. Encouraged exercise, weight loss, healthy eating habits.

## 2010-08-17 NOTE — Patient Instructions (Addendum)
Restart diabetes medications as was on previously.  Call if any problems.

## 2010-08-17 NOTE — Progress Notes (Signed)
  Subjective:    Patient ID: Joshua Campbell, male    DOB: 1960-05-31, 50 y.o.   MRN: [id number]  HPI Diabetes: Very poor control, on  1/2 dose metformin, was  off actos due to thinking  causing itching and rash. Lab Results  Component Value Date   HGBA1C 10.7* 08/10/2010   Was on prednisone for rash x 3 days.  Using medications without difficulties: Not taking regualrly. Hypoglycemic episodes: None Hyperglycemic episodes:None Feet problems:None Blood Sugars averaging: Not checking eye exam within last year:  HTN, well controlled on current meds.  Rash: diagnosed with eczema, improved with steroid topical cream. STOPPED ALL MEDICATION IN MEANTIME!!  No exercise, moderate diet control.   Review of Systems  Constitutional: Negative for fever and fatigue.  Respiratory: Negative for shortness of breath.   Cardiovascular: Negative for chest pain, palpitations and leg swelling.  Skin: Positive for rash.       Objective:   Physical Exam  Constitutional: Vital signs are normal. He appears well-developed and well-nourished.  HENT:  Head: Normocephalic.  Right Ear: Hearing normal.  Left Ear: Hearing normal.  Nose: Nose normal.  Mouth/Throat: Oropharynx is clear and moist and mucous membranes are normal.  Neck: Trachea normal. Carotid bruit is not present. No mass and no thyromegaly present.  Cardiovascular: Normal rate, regular rhythm and normal pulses.  Exam reveals no gallop, no distant heart sounds and no friction rub.   No murmur heard.      No peripheral edema  Pulmonary/Chest: Effort normal and breath sounds normal. No respiratory distress.  Skin: Skin is warm, dry and intact. Rash noted.       Dry flaky erythematous patches on legs, arms      Diabetic foot exam: Normal inspection No skin breakdown No calluses  Normal DP pulses Diminished sensation to light touch and monofilament in toes Nails normal     Assessment & Plan:

## 2010-08-17 NOTE — Assessment & Plan Note (Signed)
Well controlled. Continue current medication.  

## 2010-11-15 ENCOUNTER — Other Ambulatory Visit: Payer: 59

## 2010-11-22 ENCOUNTER — Encounter: Payer: 59 | Admitting: Family Medicine

## 2010-11-23 ENCOUNTER — Other Ambulatory Visit: Payer: Self-pay | Admitting: *Deleted

## 2010-11-23 MED ORDER — OXYCODONE HCL 5 MG PO TABS: 5.0000 mg | ORAL_TABLET | Freq: Two times a day (BID) | ORAL | Status: DC | PRN

## 2010-11-23 NOTE — Telephone Encounter (Signed)
Phoned request from patient, please call when ready.

## 2010-11-23 NOTE — Telephone Encounter (Signed)
Patient advised.

## 2010-11-28 ENCOUNTER — Emergency Department (HOSPITAL_COMMUNITY): Payer: 59

## 2010-11-28 ENCOUNTER — Emergency Department (HOSPITAL_COMMUNITY)
Admission: EM | Admit: 2010-11-28 | Discharge: 2010-11-29 | Disposition: A | Discharge status: Home / Self Care | Payer: 59 | Attending: Emergency Medicine | Admitting: Emergency Medicine

## 2010-11-28 DIAGNOSIS — R55 Syncope and collapse: Secondary | ICD-10-CM | POA: Insufficient documentation

## 2010-11-28 DIAGNOSIS — I10 Essential (primary) hypertension: Secondary | ICD-10-CM | POA: Insufficient documentation

## 2010-11-28 DIAGNOSIS — R300 Dysuria: Secondary | ICD-10-CM | POA: Insufficient documentation

## 2010-11-28 DIAGNOSIS — Z981 Arthrodesis status: Secondary | ICD-10-CM | POA: Insufficient documentation

## 2010-11-28 DIAGNOSIS — E119 Type 2 diabetes mellitus without complications: Secondary | ICD-10-CM | POA: Insufficient documentation

## 2010-11-28 DIAGNOSIS — R319 Hematuria, unspecified: Secondary | ICD-10-CM | POA: Insufficient documentation

## 2010-11-28 DIAGNOSIS — W1809XA Striking against other object with subsequent fall, initial encounter: Secondary | ICD-10-CM | POA: Insufficient documentation

## 2010-11-28 DIAGNOSIS — M5030 Other cervical disc degeneration, unspecified cervical region: Secondary | ICD-10-CM | POA: Insufficient documentation

## 2010-11-28 DIAGNOSIS — R339 Retention of urine, unspecified: Secondary | ICD-10-CM | POA: Insufficient documentation

## 2010-11-28 DIAGNOSIS — S0180XA Unspecified open wound of other part of head, initial encounter: Secondary | ICD-10-CM | POA: Insufficient documentation

## 2010-11-28 LAB — COMPREHENSIVE METABOLIC PANEL
Albumin: 3.8 g/dL (ref 3.5–5.2)
BUN: 9 mg/dL (ref 6–23)
Chloride: 95 mEq/L — ABNORMAL LOW (ref 96–112)
Creatinine, Ser: 0.91 mg/dL (ref 0.50–1.35)
Total Bilirubin: 0.4 mg/dL (ref 0.3–1.2)
Total Protein: 7.1 g/dL (ref 6.0–8.3)

## 2010-11-28 LAB — CBC
HCT: 36.5 % — ABNORMAL LOW (ref 39.0–52.0)
MCH: 23.1 pg — ABNORMAL LOW (ref 26.0–34.0)
MCHC: 31.2 g/dL (ref 30.0–36.0)
MCV: 74 fL — ABNORMAL LOW (ref 78.0–100.0)
RDW: 16.6 % — ABNORMAL HIGH (ref 11.5–15.5)

## 2010-11-28 LAB — URINALYSIS, ROUTINE W REFLEX MICROSCOPIC
Protein, ur: NEGATIVE mg/dL
Urobilinogen, UA: 1 mg/dL (ref 0.0–1.0)

## 2010-11-28 LAB — DIFFERENTIAL
Eosinophils Relative: 1 % (ref 0–5)
Lymphocytes Relative: 11 % — ABNORMAL LOW (ref 12–46)
Lymphs Abs: 1.1 10*3/uL (ref 0.7–4.0)
Monocytes Absolute: 1 10*3/uL (ref 0.1–1.0)
Monocytes Relative: 10 % (ref 3–12)

## 2010-11-28 LAB — ETHANOL: Alcohol, Ethyl (B): <11 mg/dL (ref 0–11)

## 2010-11-28 LAB — URINE MICROSCOPIC-ADD ON

## 2011-01-10 ENCOUNTER — Other Ambulatory Visit (INDEPENDENT_AMBULATORY_CARE_PROVIDER_SITE_OTHER): Payer: 59

## 2011-01-10 DIAGNOSIS — E119 Type 2 diabetes mellitus without complications: Secondary | ICD-10-CM

## 2011-01-10 LAB — COMPREHENSIVE METABOLIC PANEL
ALT: 29 U/L (ref 0–53)
CO2: 30 mEq/L (ref 19–32)
GFR: 85.39 mL/min (ref >60.00)
Sodium: 133 mEq/L — ABNORMAL LOW (ref 135–145)
Total Bilirubin: 0.3 mg/dL (ref 0.3–1.2)
Total Protein: 7.7 g/dL (ref 6.0–8.3)

## 2011-01-10 LAB — LIPID PANEL
LDL Cholesterol: 74 mg/dL (ref 0–99)
Total CHOL/HDL Ratio: 2
VLDL: 14.8 mg/dL (ref 0.0–40.0)

## 2011-01-10 LAB — HEMOGLOBIN A1C: Hgb A1c MFr Bld: 6.9 % — ABNORMAL HIGH (ref 4.6–6.5)

## 2011-01-17 ENCOUNTER — Ambulatory Visit (INDEPENDENT_AMBULATORY_CARE_PROVIDER_SITE_OTHER): Payer: 59 | Admitting: Family Medicine

## 2011-01-17 ENCOUNTER — Encounter: Payer: Self-pay | Admitting: Family Medicine

## 2011-01-17 VITALS — BP 166/78 | HR 96 | Temp 97.9°F | Ht 70.75 in | Wt 242.5 lb

## 2011-01-17 DIAGNOSIS — Z0000 Encounter for general adult medical examination without abnormal findings: Secondary | ICD-10-CM

## 2011-01-17 DIAGNOSIS — Z125 Encounter for screening for malignant neoplasm of prostate: Secondary | ICD-10-CM

## 2011-01-17 DIAGNOSIS — R319 Hematuria, unspecified: Secondary | ICD-10-CM | POA: Insufficient documentation

## 2011-01-17 DIAGNOSIS — E119 Type 2 diabetes mellitus without complications: Secondary | ICD-10-CM

## 2011-01-17 DIAGNOSIS — I10 Essential (primary) hypertension: Secondary | ICD-10-CM

## 2011-01-17 DIAGNOSIS — E871 Hypo-osmolality and hyponatremia: Secondary | ICD-10-CM

## 2011-01-17 DIAGNOSIS — R7401 Elevation of levels of liver transaminase levels: Secondary | ICD-10-CM

## 2011-01-17 DIAGNOSIS — E785 Hyperlipidemia, unspecified: Secondary | ICD-10-CM

## 2011-01-17 NOTE — Progress Notes (Signed)
Subjective:    Patient ID: Joshua Campbell, male    DOB: 1960-10-04, 50 y.o.   MRN: [id number]  HPI  The patient is here for annual wellness exam and preventative care.    He also has the following chronic health issues:  Hypertension:   Almost at goal <130/80 on current medicaiton ( lisinopril/HCTZ)  Using medication without problems or lightheadedness:  Occ intermittant lightheadednes with standing up. Chest pain with exertion:None Edema:None Short of breath:None, does get winded fairly easily, exercsies minimally. He does >25 pack year.. Had nml PFTS per pt 5 years ago. Average home BPs:  Not checking at home. Other issues: 1 month ago: Passed out after urninating and with pain in penis, had hematuria.. Passed out after. Went to ER.. nml EKG. Followed up with uro, had catherter fo a while.. No further issues.  reviewed [facility] notes.. Pt not interested in cystoscope wt this time.   Elevated Cholesterol:  LDL at goal <100 on zocor 40 mg daily. Using medications without problems:None Muscle aches: None Diet compliance:Moderate Exercise:Rare. Other complaints:  Diabetes:  Well controlled on metformin and actos.. Improved from last check now that back on both meds. Lab Results  Component Value Date   HGBA1C 6.9* 01/10/2011  Using medications without difficulties: Hypoglycemic episodes:None Hyperglycemic episodes:None Feet problems:None Blood Sugars averaging: rarely checking eye exam within last year: YEs  Chronic pain in low back: Stable on narcotic.Marland Kitchen Requests handicap car sticker.     Review of Systems  Constitutional: Negative for fever and fatigue.  HENT: Negative for ear pain.   Eyes: Negative for pain.  Respiratory: Negative for shortness of breath.   Cardiovascular: Negative for chest pain, palpitations and leg swelling.  Genitourinary: Negative for dysuria and hematuria.  Neurological: Negative for headaches.       Objective:   Physical Exam  Constitutional:  He is oriented to person, place, and time. Vital signs are normal. He appears well-developed and well-nourished.       obesity  HENT:  Head: Normocephalic.  Right Ear: Hearing normal.  Left Ear: Hearing normal.  Nose: Nose normal.  Mouth/Throat: Oropharynx is clear and moist and mucous membranes are normal.  Neck: Trachea normal. Carotid bruit is not present. No mass and no thyromegaly present.  Cardiovascular: Normal rate, regular rhythm and normal pulses.  Exam reveals no gallop, no distant heart sounds and no friction rub.   No murmur heard.      No peripheral edema  Pulmonary/Chest: Effort normal and breath sounds normal. No respiratory distress.  Neurological: He is alert and oriented to person, place, and time. No cranial nerve deficit. Coordination normal.  Skin: Skin is warm, dry and intact. No rash noted.  Psychiatric: He has a normal mood and affect. His speech is normal and behavior is normal. Thought content normal.    GU: no prostate exam or genital exam because recent one done at [facility], nml.  Diabetic foot exam: Normal inspection No skin breakdown No calluses  Normal DP pulses Normal sensation to light touch and monofilament Nails normal     Assessment & Plan:  CPX: The patient's preventative maintenance and recommended screening tests for an annual wellness exam were reviewed in full today. Brought up to date unless services declined.  Counselled on the importance of diet, exercise, and its role in overall health and mortality. The patient's FH and SH was reviewed, including their home life, tobacco status, and drug and alcohol status.   Prostate: prostate examined by uro Dr. Brunilda Payor,  PSA to be checked today. Vaccines: Uptodate with TD, due for  Flu, due for PNA given DM.Marland Kitchen Refuses both COLON: Dr. Loreta Ave Date: 11/30/2007,Next Due: 11/2012, Results: Diverticulosis

## 2011-01-17 NOTE — Assessment & Plan Note (Signed)
Well-controlled on simvastatin 40 mg daily

## 2011-01-17 NOTE — Assessment & Plan Note (Addendum)
Well controlled on today's initial measurement.  Symptoms of ? Orthostatic hypotension, but orthostatic vitals today are high, not low.

## 2011-01-17 NOTE — Patient Instructions (Addendum)
Work on regular exercise, healthy eating and weight loss. Start treadmill. If shortness of breath worsening.. come in for lung tests. If dizziness continuing... Check blood sugar and BP at home. Call with report.  It would be ideal to check blood sugars and blood pressure daily.

## 2011-01-17 NOTE — Assessment & Plan Note (Signed)
Improved control back on medication. Conintue working  exercsie and weight loss.

## 2011-01-17 NOTE — Progress Notes (Signed)
Addended by: [name] on: 01/17/2011 04:12 PM   Modules accepted: Orders

## 2011-01-17 NOTE — Assessment & Plan Note (Signed)
Resolved with decrease in alcohol use.

## 2011-01-17 NOTE — Assessment & Plan Note (Signed)
Likely due to HCTZ.. Pt hesitant about stopping given issues with swelling in [past. Will follow Na.

## 2011-01-18 ENCOUNTER — Encounter: Payer: Self-pay | Admitting: Family Medicine

## 2011-01-21 ENCOUNTER — Other Ambulatory Visit: Payer: Self-pay | Admitting: Family Medicine

## 2011-01-21 DIAGNOSIS — R972 Elevated prostate specific antigen [PSA]: Secondary | ICD-10-CM

## 2011-03-12 ENCOUNTER — Telehealth: Payer: Self-pay | Admitting: Family Medicine

## 2011-03-12 NOTE — Telephone Encounter (Signed)
To: [facility] (Daytime Triage) Fax: [phone] From: [facility] Date/ Time: 03/11/2011 12:12 PM Taken By: Crissie Figures, CSR Caller: Casimiro Needle Facility: not collected Patient: Joshua Campbell, Joshua Campbell DOB: August 10, 1960 Phone: [phone] Reason for Call: Refill on oxycodone call when ready to pick up. Regarding Appointment: Appt Date: Appt Time: Unknown Provider: Reason: Details: Outcome:

## 2011-03-20 ENCOUNTER — Other Ambulatory Visit: Payer: Self-pay | Admitting: *Deleted

## 2011-03-20 MED ORDER — OXYCODONE HCL 5 MG PO TABS: 5.0000 mg | ORAL_TABLET | Freq: Two times a day (BID) | ORAL | Status: DC | PRN

## 2011-03-20 NOTE — Telephone Encounter (Signed)
BEDSOLE PATIENT  Please call pt when ready, originally requested on 03/12/11. Please advise ASAP

## 2011-04-27 ENCOUNTER — Other Ambulatory Visit: Payer: Self-pay | Admitting: Family Medicine

## 2011-04-29 ENCOUNTER — Other Ambulatory Visit: Payer: Self-pay | Admitting: *Deleted

## 2011-04-29 NOTE — Telephone Encounter (Signed)
Okay to fill . Send inas entered to pharmacy of choice.

## 2011-04-29 NOTE — Telephone Encounter (Signed)
Patient requesting refill on Viagra but medication not on medication list. Please advise!

## 2011-04-30 MED ORDER — SILDENAFIL CITRATE 100 MG PO TABS: 50.0000 mg | ORAL_TABLET | Freq: Every day | ORAL | Status: DC | PRN

## 2011-05-08 ENCOUNTER — Other Ambulatory Visit: Payer: Self-pay

## 2011-05-08 MED ORDER — SIMVASTATIN 40 MG PO TABS: 40.0000 mg | ORAL_TABLET | Freq: Every day | ORAL | Status: DC

## 2011-05-08 MED ORDER — METFORMIN HCL ER 500 MG PO TB24: 2000.0000 mg | ORAL_TABLET | Freq: Every day | ORAL | Status: DC

## 2011-05-08 NOTE — Telephone Encounter (Signed)
Pt request Metformin(Glucophage XR) 500 mg 24 hr tab #360 x 3; Simvastatin 40 mg # 90 x 3 sent electronically to [facility]. Pt last seen 01/17/11. Pt also request Cyclobenzaprine 10 mg sent to [facility]. Pt can be reached at [phone].

## 2011-05-09 MED ORDER — CYCLOBENZAPRINE HCL 10 MG PO TABS: 10.0000 mg | ORAL_TABLET | Freq: Three times a day (TID) | ORAL | Status: DC

## 2011-07-05 ENCOUNTER — Telehealth: Payer: Self-pay | Admitting: Family Medicine

## 2011-07-05 DIAGNOSIS — E119 Type 2 diabetes mellitus without complications: Secondary | ICD-10-CM

## 2011-07-05 DIAGNOSIS — E871 Hypo-osmolality and hyponatremia: Secondary | ICD-10-CM

## 2011-07-05 DIAGNOSIS — R7401 Elevation of levels of liver transaminase levels: Secondary | ICD-10-CM

## 2011-07-05 DIAGNOSIS — I10 Essential (primary) hypertension: Secondary | ICD-10-CM

## 2011-07-05 NOTE — Telephone Encounter (Signed)
Message copied by Excell Seltzer on Fri Jul 05, 2011  2:45 PM ------      Message from: Baldomero Lamy      Created: Tue Jul 02, 2011 12:48 PM      Regarding: 6 mo f/u  labs Fri /14       Please order  future 6 mo f/ulabs for pt's upcomming lab appt.      Thanks      Rodney Booze

## 2011-07-12 ENCOUNTER — Other Ambulatory Visit: Payer: 59

## 2011-07-17 ENCOUNTER — Other Ambulatory Visit (INDEPENDENT_AMBULATORY_CARE_PROVIDER_SITE_OTHER): Payer: 59

## 2011-07-17 DIAGNOSIS — E119 Type 2 diabetes mellitus without complications: Secondary | ICD-10-CM

## 2011-07-17 DIAGNOSIS — R7401 Elevation of levels of liver transaminase levels: Secondary | ICD-10-CM

## 2011-07-17 DIAGNOSIS — I10 Essential (primary) hypertension: Secondary | ICD-10-CM

## 2011-07-17 DIAGNOSIS — E871 Hypo-osmolality and hyponatremia: Secondary | ICD-10-CM

## 2011-07-17 LAB — COMPREHENSIVE METABOLIC PANEL
ALT: 25 U/L (ref 0–53)
CO2: 31 mEq/L (ref 19–32)
Calcium: 9 mg/dL (ref 8.4–10.5)
Chloride: 94 mEq/L — ABNORMAL LOW (ref 96–112)
GFR: 89.42 mL/min (ref >60.00)
Potassium: 4.4 mEq/L (ref 3.5–5.1)
Sodium: 133 mEq/L — ABNORMAL LOW (ref 135–145)
Total Protein: 7.2 g/dL (ref 6.0–8.3)

## 2011-07-17 LAB — HEMOGLOBIN A1C: Hgb A1c MFr Bld: 7.5 % — ABNORMAL HIGH (ref 4.6–6.5)

## 2011-07-17 LAB — LIPID PANEL: Total CHOL/HDL Ratio: 2

## 2011-07-19 ENCOUNTER — Ambulatory Visit (INDEPENDENT_AMBULATORY_CARE_PROVIDER_SITE_OTHER): Payer: 59 | Admitting: Family Medicine

## 2011-07-19 ENCOUNTER — Encounter: Payer: Self-pay | Admitting: Family Medicine

## 2011-07-19 ENCOUNTER — Other Ambulatory Visit: Payer: Self-pay | Admitting: Family Medicine

## 2011-07-19 VITALS — BP 120/70 | HR 106 | Temp 97.8°F | Ht 70.75 in | Wt 257.8 lb

## 2011-07-19 DIAGNOSIS — E785 Hyperlipidemia, unspecified: Secondary | ICD-10-CM

## 2011-07-19 DIAGNOSIS — R42 Dizziness and giddiness: Secondary | ICD-10-CM | POA: Insufficient documentation

## 2011-07-19 DIAGNOSIS — I10 Essential (primary) hypertension: Secondary | ICD-10-CM

## 2011-07-19 DIAGNOSIS — E119 Type 2 diabetes mellitus without complications: Secondary | ICD-10-CM

## 2011-07-19 DIAGNOSIS — R7401 Elevation of levels of liver transaminase levels: Secondary | ICD-10-CM

## 2011-07-19 LAB — HM DIABETES FOOT EXAM

## 2011-07-19 MED ORDER — OXYCODONE HCL 5 MG PO TABS: 5.0000 mg | ORAL_TABLET | Freq: Two times a day (BID) | ORAL | Status: DC | PRN

## 2011-07-19 NOTE — Assessment & Plan Note (Signed)
Well controlled. Continue current medication.  

## 2011-07-19 NOTE — Telephone Encounter (Signed)
Patient was seen today 07/19/11 and forgot to ask for a new script for his Oxycodone 5mg . He normally gets a written Rx for this for a 3 month quantity. Please call patient when this is ready for him to pick up. Patient reports that he has enough medication to get through the weekend.

## 2011-07-19 NOTE — Progress Notes (Signed)
  Subjective:    Patient ID: Joshua Campbell, male    DOB: January 07, 1961, 51 y.o.   MRN: [id number]  HPI  Hypertension: Almost at goal <130/80 on current medicaiton ( lisinopril/HCTZ)  Using medication without problems or lightheadedness: Occ intermittant lightheadednes with standing up. Has not been able to check BP at home. Chest pain with exertion:None  Edema:None  Short of breath:None, does get winded fairly easily, exercsies minimally. He does >25 pack year.. Had nml PFTS per pt 5 years ago.  Average home BPs: Not checking at home.  Other issues:  Has been gaining weight in last year.. 15 lbs in last 6 month, 25 in last year. Exercise limited due to worsened back pain.Marland Kitchen Has appt with Dr. Danielle Dess.  Elevated Cholesterol: LDL at goal <100 on zocor 40 mg daily.  Lab Results  Component Value Date   CHOL 156 07/17/2011   HDL 62.50 07/17/2011   LDLCALC 77 07/17/2011   LDLDIRECT 114.4 11/11/2007   TRIG 84.0 07/17/2011   CHOLHDL 2 07/17/2011  Using medications without problems:None  Muscle aches: None  Diet compliance:Moderate  Exercise:Rare.  Other complaints:   Diabetes:  Worsened control on metformin and actos.[name] forgetting meds. Lab Results  Component Value Date   HGBA1C 7.5* 07/17/2011   Using medications without difficulties: None Hypoglycemic episodes:None  Hyperglycemic episodes:None  Feet problems:None  Blood Sugars averaging: rarely checking  eye exam within last year: Yes   Chronic pain in low back, worsened gradually over time:  Take oxycodone 5 mg BID, has been taking muscle relaxants as well. Exercise limited due to worsened back pain.Marland Kitchen Has appt with Dr. Danielle Dess.  Has been using ibuprofen        Review of Systems  Constitutional: Negative for fever and fatigue.       Obese  HENT: Negative for ear pain.   Eyes: Negative for pain.  Respiratory: Negative for cough.   Cardiovascular: Positive for leg swelling. Negative for chest pain and palpitations.    Gastrointestinal: Positive for constipation. Negative for nausea, diarrhea, blood in stool and abdominal distention.  Skin: Negative for rash.  Psychiatric/Behavioral: The patient is not nervous/anxious.        Objective:   Physical Exam  Constitutional: Vital signs are normal. He appears well-developed and well-nourished.  HENT:  Head: Normocephalic.  Right Ear: Hearing normal.  Left Ear: Hearing normal.  Nose: Nose normal.  Mouth/Throat: Oropharynx is clear and moist and mucous membranes are normal.  Neck: Trachea normal. Carotid bruit is not present. No mass and no thyromegaly present.  Cardiovascular: Normal rate, regular rhythm and normal pulses.  Exam reveals no gallop, no distant heart sounds and no friction rub.   No murmur heard.      No peripheral edema  Pulmonary/Chest: Effort normal and breath sounds normal. No respiratory distress.  Skin: Skin is warm, dry and intact. Rash noted.       Dry flaky erythematous patches on legs, arms     Diabetic foot exam: Normal inspection No skin breakdown No calluses  Normal DP pulses Normal sensation to light touch but diminished monofilament Nails thickened     Assessment & Plan:

## 2011-07-19 NOTE — Assessment & Plan Note (Signed)
Stable, only AST slightly elevated.

## 2011-07-19 NOTE — Telephone Encounter (Signed)
Patient advised via message on machine rx ready for pick up  

## 2011-07-19 NOTE — Patient Instructions (Addendum)
Stop at front desk to set up carotid dopplers.  Work on low impact exercise. Consider water exercise. Work on [employer], weight loss. Try to decrease alcohol further.

## 2011-07-19 NOTE — Assessment & Plan Note (Signed)
No longer on rapaflo.. Still happenening. Will evaluate for carotid stenosis.

## 2011-07-19 NOTE — Addendum Note (Signed)
Addended byKerby Nora E on: 07/19/2011 01:58 PM   Modules accepted: Orders

## 2011-07-19 NOTE — Assessment & Plan Note (Signed)
Worsened control with weight gain and poor diet. Get back on track. Discussed victoza, pt will consider if not improving at next OV. Pt refuses 3 month follow up.. Will return in 6 months.

## 2011-07-29 ENCOUNTER — Encounter (INDEPENDENT_AMBULATORY_CARE_PROVIDER_SITE_OTHER): Payer: 59

## 2011-07-29 DIAGNOSIS — R42 Dizziness and giddiness: Secondary | ICD-10-CM

## 2011-07-29 DIAGNOSIS — E785 Hyperlipidemia, unspecified: Secondary | ICD-10-CM

## 2011-07-29 DIAGNOSIS — E119 Type 2 diabetes mellitus without complications: Secondary | ICD-10-CM

## 2011-07-29 DIAGNOSIS — I6529 Occlusion and stenosis of unspecified carotid artery: Secondary | ICD-10-CM

## 2011-08-06 ENCOUNTER — Telehealth: Payer: Self-pay | Admitting: Family Medicine

## 2011-08-06 NOTE — Patient Instructions (Addendum)
Notify pt that there is some blockage in carotid vessels, but not enough for surgery.. Repeat dopplers in 1  Year to assure stability.  May be some of dizziness.  Cholesterol almost at goal to prevent further deposition of cholesterol in vessels.  

## 2011-08-06 NOTE — Telephone Encounter (Signed)
Notify pt that there is some blockage in carotid vessels, but not enough for surgery.. Repeat dopplers in 1  Year to assure stability.  May be some of dizziness.  Cholesterol almost at goal to prevent further deposition of cholesterol in vessels.

## 2011-08-07 NOTE — Telephone Encounter (Signed)
Left message for patient to return my call.

## 2011-08-08 NOTE — Telephone Encounter (Signed)
Patient advised.

## 2012-01-15 ENCOUNTER — Telehealth: Payer: Self-pay | Admitting: Family Medicine

## 2012-01-15 DIAGNOSIS — R809 Proteinuria, unspecified: Secondary | ICD-10-CM

## 2012-01-15 DIAGNOSIS — Z862 Personal history of diseases of the blood and blood-forming organs and certain disorders involving the immune mechanism: Secondary | ICD-10-CM

## 2012-01-15 DIAGNOSIS — E119 Type 2 diabetes mellitus without complications: Secondary | ICD-10-CM

## 2012-01-15 DIAGNOSIS — E785 Hyperlipidemia, unspecified: Secondary | ICD-10-CM

## 2012-01-15 NOTE — Telephone Encounter (Signed)
Message copied by Excell Seltzer on Wed Jan 15, 2012 10:15 PM ------      Message from: Joshua Campbell      Created: Wed Jan 08, 2012  1:49 PM      Regarding: Cpx labs Thurs 12/19       Please order  future cpx labs for pt's upcoming lab appt.      Thanks      Rodney Booze

## 2012-01-16 ENCOUNTER — Other Ambulatory Visit: Payer: 59

## 2012-01-24 ENCOUNTER — Encounter: Payer: 59 | Admitting: Family Medicine

## 2012-01-24 ENCOUNTER — Other Ambulatory Visit: Payer: 59

## 2012-02-07 ENCOUNTER — Encounter: Payer: 59 | Admitting: Family Medicine

## 2012-03-17 LAB — HM DIABETES EYE EXAM

## 2012-03-18 ENCOUNTER — Telehealth: Payer: Self-pay

## 2012-03-18 NOTE — Telephone Encounter (Signed)
Joshua Campbell with Dr Deirdre Evener office request  Pt med list faxed to [phone]. [name] OK to fax. Fax done.

## 2012-03-24 ENCOUNTER — Encounter: Payer: Self-pay | Admitting: Family Medicine

## 2012-04-02 ENCOUNTER — Encounter: Payer: Self-pay | Admitting: Family Medicine

## 2012-04-09 ENCOUNTER — Other Ambulatory Visit (INDEPENDENT_AMBULATORY_CARE_PROVIDER_SITE_OTHER): Payer: 59

## 2012-04-09 DIAGNOSIS — E119 Type 2 diabetes mellitus without complications: Secondary | ICD-10-CM

## 2012-04-09 DIAGNOSIS — E785 Hyperlipidemia, unspecified: Secondary | ICD-10-CM

## 2012-04-09 LAB — COMPREHENSIVE METABOLIC PANEL
ALT: 22 U/L (ref 0–53)
Alkaline Phosphatase: 68 U/L (ref 39–117)
Creatinine, Ser: 0.9 mg/dL (ref 0.4–1.5)
GFR: 97.5 mL/min (ref >60.00)
Glucose, Bld: 248 mg/dL — ABNORMAL HIGH (ref 70–99)
Sodium: 129 mEq/L — ABNORMAL LOW (ref 135–145)
Total Bilirubin: 0.5 mg/dL (ref 0.3–1.2)
Total Protein: 8.3 g/dL (ref 6.0–8.3)

## 2012-04-09 LAB — LIPID PANEL
Cholesterol: 218 mg/dL — ABNORMAL HIGH (ref 0–200)
HDL: 32.3 mg/dL — ABNORMAL LOW (ref >39.00)
Total CHOL/HDL Ratio: 7
VLDL: 85.6 mg/dL — ABNORMAL HIGH (ref 0.0–40.0)

## 2012-04-16 ENCOUNTER — Other Ambulatory Visit: Payer: 59

## 2012-04-23 ENCOUNTER — Encounter: Payer: Self-pay | Admitting: Family Medicine

## 2012-04-23 ENCOUNTER — Ambulatory Visit (INDEPENDENT_AMBULATORY_CARE_PROVIDER_SITE_OTHER): Payer: 59 | Admitting: Family Medicine

## 2012-04-23 VITALS — BP 130/84 | HR 93 | Temp 98.1°F | Ht 70.25 in | Wt 233.0 lb

## 2012-04-23 DIAGNOSIS — R7401 Elevation of levels of liver transaminase levels: Secondary | ICD-10-CM

## 2012-04-23 DIAGNOSIS — I10 Essential (primary) hypertension: Secondary | ICD-10-CM

## 2012-04-23 DIAGNOSIS — R42 Dizziness and giddiness: Secondary | ICD-10-CM

## 2012-04-23 DIAGNOSIS — E785 Hyperlipidemia, unspecified: Secondary | ICD-10-CM

## 2012-04-23 DIAGNOSIS — Z0000 Encounter for general adult medical examination without abnormal findings: Secondary | ICD-10-CM

## 2012-04-23 DIAGNOSIS — E119 Type 2 diabetes mellitus without complications: Secondary | ICD-10-CM

## 2012-04-23 DIAGNOSIS — E871 Hypo-osmolality and hyponatremia: Secondary | ICD-10-CM

## 2012-04-23 DIAGNOSIS — R7402 Elevation of levels of lactic acid dehydrogenase (LDH): Secondary | ICD-10-CM

## 2012-04-23 LAB — BASIC METABOLIC PANEL
CO2: 28 mEq/L (ref 19–32)
Calcium: 9.2 mg/dL (ref 8.4–10.5)
Creatinine, Ser: 0.8 mg/dL (ref 0.4–1.5)
GFR: 105.86 mL/min (ref >60.00)
Glucose, Bld: 150 mg/dL — ABNORMAL HIGH (ref 70–99)
Sodium: 132 mEq/L — ABNORMAL LOW (ref 135–145)

## 2012-04-23 MED ORDER — OXYCODONE HCL 5 MG PO TABS: 5.0000 mg | ORAL_TABLET | Freq: Two times a day (BID) | ORAL | Status: DC | PRN

## 2012-04-23 MED ORDER — LISINOPRIL 10 MG PO TABS: 10.0000 mg | ORAL_TABLET | Freq: Every day | ORAL | Status: DC

## 2012-04-23 MED ORDER — CYCLOBENZAPRINE HCL 10 MG PO TABS: 10.0000 mg | ORAL_TABLET | Freq: Three times a day (TID) | ORAL | Status: DC

## 2012-04-23 NOTE — Progress Notes (Signed)
The patient is here for annual wellness exam and preventative care.   He also has the following chronic health issues:   [place]s [place] beach.. Was out for 15 days... Now back on only 4 days.  Hypertension: Stopped the lisinopril on 3/14 given low sodium. Using medication without problems or lightheadedness: Occ intermittant lightheadednes with standing up.... This has now improved off BP med. Chest pain with exertion:None  Edema:None  Short of breath:None, does get winded fairly easily, exercsies minimally. He does >25 pack year.. Had nml PFTS per pt 5 years ago.  Average home BPs: 130/80s Other issues:   Last year: Passed out after urninating and with pain in penis, had hematuria.. Passed out after.  Went to ER.. nml EKG. Followed up with uro, had catherter fo a while.. No further issues.  reviewed [facility] notes.. Pt not interested in cystoscope wt this time.  He states today he has no further issues at this time.  Elevated Cholesterol: Previously LDL at goal <100 on zocor 40 mg daily. He had only been back on medication for 4 days... Labs poor.  Lab Results  Component Value Date   CHOL 218* 04/09/2012   HDL 32.30* 04/09/2012   LDLCALC 77 07/17/2011   LDLDIRECT 120.5 04/09/2012   TRIG 428.0 Triglyceride is over 400; calculations on Lipids are invalid.* 04/09/2012   CHOLHDL 7 04/09/2012  Using medications without problems:None  Muscle aches: None  Diet compliance:Moderate  Exercise: Rare.  Other complaints:   Diabetes: Worsened control on half dose metformin ( decreased given lightheadedness x few months and actos) Was also out of these meds for 15 days as well. Lab Results  Component Value Date   HGBA1C 10.9* 04/09/2012  He has been out of medication for 15 days Using medications without difficulties:  Hypoglycemic episodes:None  Hyperglycemic episodes:None  Feet problems:None  Blood Sugars averaging: rarely checking  eye exam within last year: Yes   Chronic pain in low back:  Stable on narcotic.Marland Kitchen Requests handicap car sticker.   Has been out of pain med... Pain poorly controlled with aleve.  Referred pain to right butt cheek. Seen Dr. Danielle Dess in past.  Wt Readings from Last 3 Encounters:  04/23/12 233 lb (105.688 kg)  07/19/11 257 lb 12 oz (116.915 kg)  01/17/11 242 lb 8 oz (109.997 kg)  Had decreased alcohol and eating better.   Review of Systems  Constitutional: Negative for fever and fatigue.  HENT: Negative for ear pain.  Eyes: Negative for pain.  Respiratory: Negative for shortness of breath.  Cardiovascular: Negative for chest pain, palpitations and leg swelling.  Genitourinary: Negative for dysuria and hematuria.  Neurological: Negative for headaches.  Objective:   Physical Exam  Constitutional: He is oriented to person, place, and time. Vital signs are normal. He appears well-developed and well-nourished.  obesity  HENT:  Head: Normocephalic.  Right Ear: Hearing normal.  Left Ear: Hearing normal.  Nose: Nose normal.  Mouth/Throat: Oropharynx is clear and moist and mucous membranes are normal.  Neck: Trachea normal. Carotid bruit is not present. No mass and no thyromegaly present.  Cardiovascular: Normal rate, regular rhythm and normal pulses. Exam reveals no gallop, no distant heart sounds and no friction rub.  No murmur heard. mild peripheral edema  Pulmonary/Chest: Effort normal and breath sounds normal. No respiratory distress.  Neurological: He is alert and oriented to person, place, and time. No cranial nerve deficit. Coordination normal.  Skin: Skin is warm, dry and intact. No rash noted.  Psychiatric: He has a normal mood and affect. His speech is normal and behavior is normal. Thought content normal.  GU: no prostate exam or genital exam because recent one done at [facility], nml.  Diabetic foot exam:  Normal inspection  No skin breakdown  Mild calluses  Normal DP pulses  Decreased sensation to light touch and monofilament  Nails  normal  Assessment & Plan:   CPX: The patient's preventative maintenance and recommended screening tests for an annual wellness exam were reviewed in full today.  Brought up to date unless services declined.  Counselled on the importance of diet, exercise, and its role in overall health and mortality.  The patient's FH and SH was reviewed, including their home life, tobacco status, and drug and alcohol status.   Prostate: prostate examined by uro Dr. Brunilda Payor in past.. Plans to do PSAand exam there. Lab Results  Component Value Date   PSA 2.24 01/21/2011   PSA 2.32 01/17/2011   PSA 0.67 10/31/2009  Vaccines: Uptodate with TD, due for Flu, due for PNA given DM.Marland Kitchen Refuses both  COLON: Dr. Loreta Ave Date: 11/30/2007,Next Due: 11/2012, Results: Diverticulosis, mother with colon cancer age 62.

## 2012-04-23 NOTE — Assessment & Plan Note (Signed)
Off HCTZ. Recheck.

## 2012-04-23 NOTE — Assessment & Plan Note (Signed)
Resolved off BP meds 

## 2012-04-23 NOTE — Patient Instructions (Addendum)
Follow BP at home , goal <130/80.  Start back on lisinopril at lower dose.  Stay off the lisinopril/ HCTZ combination..  Stop at lab on your way out.  Return to previous dose metformin.  Continue working on lifestyle changes.  Follow up in 3 months with fasting labs prior.   Return to Dr. Brunilda Payor, for prostate check.  Make sure to set up colonoscopy with Dr. Loreta Ave.

## 2012-04-23 NOTE — Assessment & Plan Note (Signed)
Borderline control. Restart lisinopril alone at lower 10 mg dose. Follow BP at home.

## 2012-04-23 NOTE — Assessment & Plan Note (Signed)
Poor control. May be due to being on lower dose metfomrin or just progression of disease Get back on track with meds and continue weight loss and lifestyle changes. Recheck in 3 months.

## 2012-04-23 NOTE — Assessment & Plan Note (Signed)
Resolved off tylenol and with decrease in alchol.

## 2012-04-23 NOTE — Assessment & Plan Note (Signed)
Poor control. Get back on track with meds and continue weight loss and lifestyle changes.

## 2012-05-27 ENCOUNTER — Telehealth: Payer: Self-pay

## 2012-05-27 NOTE — Telephone Encounter (Signed)
Pt left v/m requesting cialis sent to [facility] [address]Please advise.(not on med list). Pt said took Cialis(could not remember mg) years ago and wanted to try again.

## 2012-05-28 MED ORDER — TADALAFIL 20 MG PO TABS: 20.0000 mg | ORAL_TABLET | Freq: Every day | ORAL | Status: DC | PRN

## 2012-07-27 ENCOUNTER — Other Ambulatory Visit: Payer: 59

## 2012-08-04 ENCOUNTER — Ambulatory Visit: Payer: 59 | Admitting: Family Medicine

## 2012-11-24 ENCOUNTER — Encounter: Payer: Self-pay | Admitting: Family Medicine

## 2012-11-24 ENCOUNTER — Ambulatory Visit (INDEPENDENT_AMBULATORY_CARE_PROVIDER_SITE_OTHER): Payer: 59 | Admitting: Family Medicine

## 2012-11-24 VITALS — BP 134/62 | HR 109 | Temp 98.2°F | Ht 70.25 in | Wt 221.5 lb

## 2012-11-24 DIAGNOSIS — E785 Hyperlipidemia, unspecified: Secondary | ICD-10-CM

## 2012-11-24 DIAGNOSIS — IMO0001 Reserved for inherently not codable concepts without codable children: Secondary | ICD-10-CM

## 2012-11-24 DIAGNOSIS — I10 Essential (primary) hypertension: Secondary | ICD-10-CM

## 2012-11-24 DIAGNOSIS — M545 Low back pain, unspecified: Secondary | ICD-10-CM

## 2012-11-24 DIAGNOSIS — F528 Other sexual dysfunction not due to a substance or known physiological condition: Secondary | ICD-10-CM

## 2012-11-24 DIAGNOSIS — E871 Hypo-osmolality and hyponatremia: Secondary | ICD-10-CM

## 2012-11-24 MED ORDER — TADALAFIL 20 MG PO TABS: 20.0000 mg | ORAL_TABLET | Freq: Every day | ORAL | Status: DC | PRN

## 2012-11-24 MED ORDER — TADALAFIL 20 MG PO TABS
20.0000 mg | ORAL_TABLET | Freq: Every day | ORAL | Status: DC | PRN
Start: 2012-11-24 — End: 2013-05-25

## 2012-11-24 MED ORDER — OXYCODONE HCL 5 MG PO TABS: 5.0000 mg | ORAL_TABLET | Freq: Two times a day (BID) | ORAL | Status: DC | PRN

## 2012-11-24 NOTE — Assessment & Plan Note (Signed)
Due for re-eval. 

## 2012-11-24 NOTE — Assessment & Plan Note (Signed)
Refilled cialis

## 2012-11-24 NOTE — Assessment & Plan Note (Signed)
Refilled oxycodone rx. 

## 2012-11-24 NOTE — Progress Notes (Signed)
Hypertension: Stopped the lisinopril on 3/14 given low sodium.  Using medication without problems or lightheadedness: Occ intermittant lightheadednes with standing up.... This has now improved off BP med.  Chest pain with exertion:None  Edema:None  Short of breath:None, does get winded fairly easily, exercsies minimally. He does >25 pack year.. Had nml PFTS per pt 5 years ago.  Average home BPs: 130/70s  Other issues:   Elevated Cholesterol: Due for re-eval.  Previously LDL at goal <100 on zocor 40 mg daily.  Lab Results  Component Value Date   CHOL 218* 04/09/2012   HDL 32.30* 04/09/2012   LDLCALC 77 07/17/2011   LDLDIRECT 120.5 04/09/2012   TRIG 428.0 Triglyceride is over 400; calculations on Lipids are invalid.* 04/09/2012   CHOLHDL 7 04/09/2012    Using medications without problems:None  Muscle aches: None  Diet compliance:Moderate  Exercise: Rare.  Other complaints:   Diabetes:  Due for lab re-eval. At last OV: Worsened control on half dose metformin ( decreased given lightheadedness x few months and actos) Was also out of these meds for 15 days as well.  Lab Results   Component  Value  Date    HGBA1C  10.9*  04/09/2012   He has been out of medication for 15 days  Using medications without difficulties:  Hypoglycemic episodes:None  Hyperglycemic episodes:None  Feet problems:None  Blood Sugars averaging: rarely checking  eye exam within last year: Yes   Chronic pain in low back: Stable on narcotic.  He did run out of oxycodone given [place] the beach.  Pain poorly controlled with aleve.  Referred pain to right butt cheek.  Seen Dr. Danielle Dess in past.   He has had some dry cough x 1-2 weeks. Other symptoms of URI ST and ear pain has improved. No fever. No change in SOB.   He has been working on weight loss and he has lost > 10 lbs. Wt Readings from Last 3 Encounters:  11/24/12 221 lb 8 oz (100.472 kg)  04/23/12 233 lb (105.688 kg)  07/19/11 257 lb 12 oz (116.915 kg)   Had decreased alcohol and eating better.   Review of Systems  Constitutional: Negative for fever and fatigue.  HENT: Negative for ear pain.  Eyes: Negative for pain.  Respiratory: Negative for shortness of breath.  Cardiovascular: Negative for chest pain, palpitations and leg swelling.  Genitourinary: Negative for dysuria and hematuria.  Neurological: Negative for headaches.  Objective:   Physical Exam  Constitutional: He is oriented to person, place, and time. Vital signs are normal. He appears well-developed and well-nourished.  obesity  HENT:  Head: Normocephalic.  Right Ear: Hearing normal.  Left Ear: Hearing normal.  Nose: Nose normal.  Mouth/Throat: Oropharynx is clear and moist and mucous membranes are normal.  Neck: Trachea normal. Carotid bruit is not present. No mass and no thyromegaly present.  Cardiovascular: Normal rate, regular rhythm and normal pulses. Exam reveals no gallop, no distant heart sounds and no friction rub.  No murmur heard.  mild peripheral edema  Pulmonary/Chest: Effort normal and breath sounds normal. No respiratory distress.  Neurological: He is alert and oriented to person, place, and time. No cranial nerve deficit. Coordination normal.  Skin: Skin is warm, dry and intact. No rash noted.  Psychiatric: He has a normal mood and affect. His speech is normal and behavior is normal. Thought content normal.  GU: no prostate exam or genital exam because recent one done at [facility], nml.   Diabetic foot exam:  Normal inspection  No skin breakdown  Mild calluses  Normal DP pulses  Decreased sensation to light touch and monofilament  Nails normal

## 2012-11-24 NOTE — Patient Instructions (Addendum)
Return for fasting labs in 1 months. Keep working on exercise , weight loss and healthy eating. Expect cough to gradually improve with time.. Call if not improving as expected in 2-3 weeks.

## 2012-11-24 NOTE — Assessment & Plan Note (Signed)
Well controlled. Continue current medication.  

## 2012-12-09 ENCOUNTER — Other Ambulatory Visit: Payer: Self-pay | Admitting: *Deleted

## 2012-12-09 ENCOUNTER — Other Ambulatory Visit: Payer: Self-pay | Admitting: Family Medicine

## 2012-12-09 NOTE — Telephone Encounter (Signed)
Last office visit 11/24/2012.  Ok to refill? 

## 2012-12-10 MED ORDER — CYCLOBENZAPRINE HCL 10 MG PO TABS: 10.0000 mg | ORAL_TABLET | Freq: Three times a day (TID) | ORAL | Status: DC

## 2013-01-14 ENCOUNTER — Telehealth: Payer: Self-pay

## 2013-01-14 MED ORDER — LOSARTAN POTASSIUM 25 MG PO TABS: 25.0000 mg | ORAL_TABLET | Freq: Every day | ORAL | Status: DC

## 2013-01-14 NOTE — Telephone Encounter (Signed)
See note

## 2013-01-14 NOTE — Telephone Encounter (Signed)
Lisinopril can cause SE of dry cough. Stop and cahnge to losartan daily. Rx sent in to [facility].  Follow BP at home.. Goal <140 /90 call if not as well controlled on different BP med.

## 2013-01-14 NOTE — Telephone Encounter (Signed)
Pt left vm; pt asked at pharmacy for med for dry cough; pharmacist said side effect of lisinopril is cough. Pt wants to know if should change meds or what to do. [place] [facility] [place].

## 2013-01-15 MED ORDER — LOSARTAN POTASSIUM 25 MG PO TABS: 25.0000 mg | ORAL_TABLET | Freq: Every day | ORAL | Status: DC

## 2013-01-15 NOTE — Telephone Encounter (Signed)
Patient notified as instructed.  Joshua Campbell is currently at [facility] and would like [facility] on [address].  Losartan 25 mg take one tablet by mouth daily #30 with no refills sent to that pharmacy.  Patient will stop lisinopril and start losartan.  Advised to monitor his blood pressures at home and to call us back if it is not well controlled consistently >140/90.  Patient states understanding.

## 2013-01-15 NOTE — Addendum Note (Signed)
Addended by: Damita Lack on: 01/15/2013 11:46 AM   Modules accepted: Orders

## 2013-03-26 ENCOUNTER — Telehealth: Payer: Self-pay | Admitting: *Deleted

## 2013-03-26 NOTE — Telephone Encounter (Signed)
Received fax from [facility] Non adherence therapy advisory on Metformin and Pioglitazone.  Called to verify with patient if he is  taking his diabetic medications.  [place] just stocked up on his precriptions before leaving to go out of town.  He states he has plenty of his diabetic medications and that he is taking it daily.  He also wanted to let Dr. Ermalene SearingBedsole know that he went to Urgent Care [place] and was diagnosed with Pneumonia.  He states they gave him a cortisone shot, Amoxicillin 500 mg, three times a day, Ventolin inhaler, Loratadine, one daily and Robitussin DM.  He is to follow up with them on Wednesday.

## 2013-03-26 NOTE — Telephone Encounter (Signed)
Noted  

## 2013-04-06 ENCOUNTER — Other Ambulatory Visit: Payer: Self-pay | Admitting: Family Medicine

## 2013-05-10 ENCOUNTER — Other Ambulatory Visit: Payer: Self-pay

## 2013-05-10 NOTE — Telephone Encounter (Signed)
Pt left v/m requesting rx oxycodone; call when ready for pick up. Pt has appt scheduled with Dr Ermalene SearingBedsole on 05/25/13.

## 2013-05-11 MED ORDER — OXYCODONE HCL 5 MG PO TABS: 5.0000 mg | ORAL_TABLET | Freq: Two times a day (BID) | ORAL | Status: DC | PRN

## 2013-05-11 NOTE — Telephone Encounter (Signed)
Joshua Campbell notified prescription is ready to be picked up at front desk.

## 2013-05-18 ENCOUNTER — Encounter: Payer: Self-pay | Admitting: *Deleted

## 2013-05-18 ENCOUNTER — Telehealth: Payer: Self-pay | Admitting: Family Medicine

## 2013-05-18 ENCOUNTER — Other Ambulatory Visit: Payer: Self-pay | Admitting: Family Medicine

## 2013-05-18 ENCOUNTER — Other Ambulatory Visit (INDEPENDENT_AMBULATORY_CARE_PROVIDER_SITE_OTHER): Payer: 59

## 2013-05-18 DIAGNOSIS — I10 Essential (primary) hypertension: Secondary | ICD-10-CM

## 2013-05-18 DIAGNOSIS — E119 Type 2 diabetes mellitus without complications: Secondary | ICD-10-CM

## 2013-05-18 DIAGNOSIS — E1165 Type 2 diabetes mellitus with hyperglycemia: Principal | ICD-10-CM

## 2013-05-18 DIAGNOSIS — IMO0001 Reserved for inherently not codable concepts without codable children: Secondary | ICD-10-CM

## 2013-05-18 DIAGNOSIS — Z862 Personal history of diseases of the blood and blood-forming organs and certain disorders involving the immune mechanism: Secondary | ICD-10-CM

## 2013-05-18 DIAGNOSIS — E785 Hyperlipidemia, unspecified: Secondary | ICD-10-CM

## 2013-05-18 LAB — COMPREHENSIVE METABOLIC PANEL
ALK PHOS: 41 U/L (ref 39–117)
ALT: 20 U/L (ref 0–53)
AST: 26 U/L (ref 0–37)
Albumin: 3.9 g/dL (ref 3.5–5.2)
BILIRUBIN TOTAL: 0.6 mg/dL (ref 0.3–1.2)
BUN: 7 mg/dL (ref 6–23)
CO2: 30 mEq/L (ref 19–32)
Calcium: 9.5 mg/dL (ref 8.4–10.5)
Chloride: 98 mEq/L (ref 96–112)
Creatinine, Ser: 0.8 mg/dL (ref 0.4–1.5)
GFR: 111.78 mL/min (ref >60.00)
GLUCOSE: 116 mg/dL — AB (ref 70–99)
Potassium: 4.6 mEq/L (ref 3.5–5.1)
SODIUM: 137 meq/L (ref 135–145)
TOTAL PROTEIN: 7.7 g/dL (ref 6.0–8.3)

## 2013-05-18 LAB — HEMOGLOBIN A1C: HEMOGLOBIN A1C: 8.5 % — AB (ref 4.6–6.5)

## 2013-05-18 LAB — LIPID PANEL
Cholesterol: 206 mg/dL — ABNORMAL HIGH (ref 0–200)
HDL: 48.3 mg/dL (ref >39.00)
LDL Cholesterol: 133 mg/dL — ABNORMAL HIGH (ref 0–99)
Total CHOL/HDL Ratio: 4
Triglycerides: 124 mg/dL (ref 0.0–149.0)
VLDL: 24.8 mg/dL (ref 0.0–40.0)

## 2013-05-18 LAB — HM DIABETES EYE EXAM

## 2013-05-18 NOTE — Telephone Encounter (Signed)
Labs already ordered

## 2013-05-18 NOTE — Telephone Encounter (Signed)
Message copied by Kerby NoraBEDSOLE, AMY E on Tue May 18, 2013  7:22 AM ------      Message from: [place], [place]      Created: Wed May 12, 2013  3:48 PM      Regarding: Lab orders for Tuesday, 4.21.15       Patient is scheduled for CPX labs, please order future labs, Thanks , Terri       ------

## 2013-05-19 ENCOUNTER — Telehealth: Payer: Self-pay | Admitting: Family Medicine

## 2013-05-19 NOTE — Telephone Encounter (Signed)
Relevant patient education mailed to patient.  

## 2013-05-25 ENCOUNTER — Encounter: Payer: Self-pay | Admitting: Family Medicine

## 2013-05-25 ENCOUNTER — Ambulatory Visit (INDEPENDENT_AMBULATORY_CARE_PROVIDER_SITE_OTHER): Payer: 59 | Admitting: Family Medicine

## 2013-05-25 VITALS — BP 140/78 | HR 102 | Temp 97.5°F | Ht 70.25 in | Wt 231.5 lb

## 2013-05-25 DIAGNOSIS — E1165 Type 2 diabetes mellitus with hyperglycemia: Secondary | ICD-10-CM

## 2013-05-25 DIAGNOSIS — E785 Hyperlipidemia, unspecified: Secondary | ICD-10-CM

## 2013-05-25 DIAGNOSIS — M545 Low back pain, unspecified: Secondary | ICD-10-CM

## 2013-05-25 DIAGNOSIS — I10 Essential (primary) hypertension: Secondary | ICD-10-CM

## 2013-05-25 DIAGNOSIS — IMO0001 Reserved for inherently not codable concepts without codable children: Secondary | ICD-10-CM

## 2013-05-25 LAB — HM DIABETES FOOT EXAM

## 2013-05-25 MED ORDER — CYCLOBENZAPRINE HCL 10 MG PO TABS: 10.0000 mg | ORAL_TABLET | Freq: Three times a day (TID) | ORAL | Status: DC

## 2013-05-25 MED ORDER — PIOGLITAZONE HCL 45 MG PO TABS: 45.0000 mg | ORAL_TABLET | Freq: Every day | ORAL | Status: DC

## 2013-05-25 MED ORDER — SILDENAFIL CITRATE 100 MG PO TABS: 100.0000 mg | ORAL_TABLET | Freq: Every day | ORAL | Status: DC | PRN

## 2013-05-25 MED ORDER — SIMVASTATIN 40 MG PO TABS: 40.0000 mg | ORAL_TABLET | Freq: Every day | ORAL | Status: DC

## 2013-05-25 NOTE — Assessment & Plan Note (Signed)
Not at goal < decrease alcohol, make lifestyle changes. Increase to actos 45 mg daily.

## 2013-05-25 NOTE — Assessment & Plan Note (Signed)
Not at goal , but he has not been taking med regularly. Restart and recheck in 3-6 months.

## 2013-05-25 NOTE — Progress Notes (Signed)
53 year old male presents for 6 month follow up DM.  Hypertension: Not at goal < 130/80 on losartan 25 mg daily, hacking cough better off ACEI. BP Readings from Last 3 Encounters:  05/25/13 140/78  11/24/12 134/62  04/23/12 130/84  Using medication without problems or lightheadedness: Chest pain with exertion:None  Edema:None  Short of breath:None, does get winded fairly easily, exercsies minimally. He does >25 pack year.. Had nml PFTS per pt 5 years ago.  Average home BPs:not checking Other issues:    Hyponatremia has resolved on diferent BP emd.  Elevated Cholesterol:  Trigs better, but LDL not at goal on zocor 40 mg daily  previously had been at goal on this med, but he has forgotten to take this.  Lab Results  Component Value Date   CHOL 206* 05/18/2013   HDL 48.30 05/18/2013   LDLCALC 133* 05/18/2013   LDLDIRECT 120.5 04/09/2012   TRIG 124.0 05/18/2013   CHOLHDL 4 05/18/2013   Using medications without problems:None  Muscle aches: None  Diet compliance:Moderate  Exercise: Rare.  Other complaints:   Diabetes: Due for lab re-eval. Improved control but not at goal on actos  30 mgand metformin  Lab Results  Component Value Date   HGBA1C 8.5* 05/18/2013  Using medications without difficulties:  Hypoglycemic episodes:None  Hyperglycemic episodes:None  Feet problems:None  Blood Sugars averaging: rarely checking  eye exam within last year: Yes   Chronic pain in low back: Stable on narcotic.  Seen Dr. Danielle DessElsner in past.   Hit right knee on car door 3 months ago. Had pain and swelling . Pain and swelling is improvoing slowly, but pain with going down steps.  Wt Readings from Last 3 Encounters:  05/25/13 231 lb 8 oz (105.008 kg)  11/24/12 221 lb 8 oz (100.472 kg)  04/23/12 233 lb (105.688 kg)   Review of Systems  Constitutional: Negative for fever and fatigue.  HENT: Negative for ear pain.  Eyes: Negative for pain.  Respiratory: Negative for shortness of breath.   Cardiovascular: Negative for chest pain, palpitations and leg swelling.  Genitourinary: Negative for dysuria and hematuria.  Neurological: Negative for headaches.  Objective:   Physical Exam  Constitutional: He is oriented to person, place, and time. Vital signs are normal. He appears well-developed and well-nourished.  obesity  HENT:  Head: Normocephalic.  Right Ear: Hearing normal.  Left Ear: Hearing normal.  Nose: Nose normal.  Mouth/Throat: Oropharynx is clear and moist and mucous membranes are normal.  Neck: Trachea normal. Carotid bruit is not present. No mass and no thyromegaly present.  Cardiovascular: Normal rate, regular rhythm and normal pulses. Exam reveals no gallop, no distant heart sounds and no friction rub.  No murmur heard.  mild peripheral edema  Pulmonary/Chest: Effort normal and breath sounds normal. No respiratory distress.  Neurological: He is alert and oriented to person, place, and time. No cranial nerve deficit. Coordination normal.  Skin: Skin is warm, dry and intact. No rash noted.  Psychiatric: He has a normal mood and affect. His speech is normal and behavior is normal. Thought content normal.   Diabetic foot exam:  Normal inspection  No skin breakdown  Mild calluses  Normal DP pulses  Decreased sensation to light touch and monofilament  Nails normal

## 2013-05-25 NOTE — Progress Notes (Signed)
Pre visit review using our clinic review tool, if applicable. No additional management support is needed unless otherwise documented below in the visit note. 

## 2013-05-25 NOTE — Patient Instructions (Addendum)
Follow BP at home, goal < 130/80, call if it is consistently above this. Get back on track taking simvastatin.  Work on increasing exercise, and low carb diet. Increase actos to 45 mg daily. Schedule follow up in 3 months with  A1C prior

## 2013-05-25 NOTE — Assessment & Plan Note (Signed)
Follow BP at home, goal < 130/80, call if it is consistently elevated.

## 2013-05-26 ENCOUNTER — Telehealth: Payer: Self-pay | Admitting: Family Medicine

## 2013-05-26 NOTE — Telephone Encounter (Signed)
Relevant patient education mailed to patient.  

## 2013-05-28 ENCOUNTER — Telehealth: Payer: Self-pay

## 2013-05-28 NOTE — Telephone Encounter (Signed)
Relevant patient education mailed to patient.  

## 2013-06-23 ENCOUNTER — Encounter: Payer: Self-pay | Admitting: Family Medicine

## 2013-07-26 ENCOUNTER — Telehealth: Payer: Self-pay | Admitting: *Deleted

## 2013-07-26 NOTE — Telephone Encounter (Signed)
Patient called requesting that a copy of his last 3-4 office notes be faxed to [facility] [id number].Marland Kitchen. [place]. Advised patient that we will need a medical release signed by him in order to do this. Patient stated that he will see about getting this signed and faxed over when he goes there tomorrow.

## 2013-08-06 ENCOUNTER — Telehealth: Payer: Self-pay | Admitting: Family Medicine

## 2013-08-06 NOTE — Telephone Encounter (Signed)
Diabetic Bundle.  Pt needs follow up visit to check BP, LDL, and A1C.  Left vm for pt to return call. 

## 2013-12-13 ENCOUNTER — Telehealth: Payer: Self-pay | Admitting: Family Medicine

## 2013-12-13 DIAGNOSIS — E1165 Type 2 diabetes mellitus with hyperglycemia: Secondary | ICD-10-CM

## 2013-12-13 DIAGNOSIS — E785 Hyperlipidemia, unspecified: Secondary | ICD-10-CM

## 2013-12-13 DIAGNOSIS — IMO0002 Reserved for concepts with insufficient information to code with codable children: Secondary | ICD-10-CM

## 2013-12-13 NOTE — Telephone Encounter (Signed)
-----   Message from Alvina Chouerri J Walsh sent at 12/09/2013  3:31 PM EST ----- Regarding: Lab orders for Tuesday, 11.17.15 Lab orders for a f/u appt

## 2013-12-14 ENCOUNTER — Other Ambulatory Visit (INDEPENDENT_AMBULATORY_CARE_PROVIDER_SITE_OTHER): Payer: 59

## 2013-12-14 DIAGNOSIS — E785 Hyperlipidemia, unspecified: Secondary | ICD-10-CM

## 2013-12-14 DIAGNOSIS — E1165 Type 2 diabetes mellitus with hyperglycemia: Secondary | ICD-10-CM

## 2013-12-14 DIAGNOSIS — IMO0002 Reserved for concepts with insufficient information to code with codable children: Secondary | ICD-10-CM

## 2013-12-14 LAB — HEMOGLOBIN A1C: Hgb A1c MFr Bld: 11.3 % — ABNORMAL HIGH (ref 4.6–6.5)

## 2013-12-15 LAB — COMPREHENSIVE METABOLIC PANEL
ALT: 35 U/L (ref 0–53)
AST: 29 U/L (ref 0–37)
Albumin: 3.7 g/dL (ref 3.5–5.2)
Alkaline Phosphatase: 59 U/L (ref 39–117)
BILIRUBIN TOTAL: 0.9 mg/dL (ref 0.2–1.2)
BUN: 11 mg/dL (ref 6–23)
CALCIUM: 8.9 mg/dL (ref 8.4–10.5)
CO2: 28 meq/L (ref 19–32)
CREATININE: 0.8 mg/dL (ref 0.4–1.5)
Chloride: 94 mEq/L — ABNORMAL LOW (ref 96–112)
GFR: 102.29 mL/min (ref >60.00)
Glucose, Bld: 308 mg/dL — ABNORMAL HIGH (ref 70–99)
Potassium: 4.4 mEq/L (ref 3.5–5.1)
Sodium: 133 mEq/L — ABNORMAL LOW (ref 135–145)
Total Protein: 7.5 g/dL (ref 6.0–8.3)

## 2013-12-15 LAB — LIPID PANEL
CHOL/HDL RATIO: 4
Cholesterol: 196 mg/dL (ref 0–200)
HDL: 51.5 mg/dL (ref >39.00)
LDL CALC: 113 mg/dL — AB (ref 0–99)
NONHDL: 144.5
Triglycerides: 156 mg/dL — ABNORMAL HIGH (ref 0.0–149.0)
VLDL: 31.2 mg/dL (ref 0.0–40.0)

## 2013-12-16 ENCOUNTER — Ambulatory Visit (INDEPENDENT_AMBULATORY_CARE_PROVIDER_SITE_OTHER): Payer: 59 | Admitting: Family Medicine

## 2013-12-16 ENCOUNTER — Encounter: Payer: Self-pay | Admitting: Family Medicine

## 2013-12-16 VITALS — BP 140/70 | HR 96 | Temp 98.0°F | Wt 249.0 lb

## 2013-12-16 DIAGNOSIS — IMO0002 Reserved for concepts with insufficient information to code with codable children: Secondary | ICD-10-CM

## 2013-12-16 DIAGNOSIS — R5383 Other fatigue: Secondary | ICD-10-CM | POA: Insufficient documentation

## 2013-12-16 DIAGNOSIS — R5382 Chronic fatigue, unspecified: Secondary | ICD-10-CM

## 2013-12-16 DIAGNOSIS — E785 Hyperlipidemia, unspecified: Secondary | ICD-10-CM

## 2013-12-16 DIAGNOSIS — E1165 Type 2 diabetes mellitus with hyperglycemia: Secondary | ICD-10-CM

## 2013-12-16 DIAGNOSIS — I10 Essential (primary) hypertension: Secondary | ICD-10-CM

## 2013-12-16 LAB — HM DIABETES FOOT EXAM

## 2013-12-16 MED ORDER — OXYCODONE HCL 5 MG PO TABS: 5.0000 mg | ORAL_TABLET | Freq: Two times a day (BID) | ORAL | Status: DC | PRN

## 2013-12-16 NOTE — Progress Notes (Signed)
Pre visit review using our clinic review tool, if applicable. No additional management support is needed unless otherwise documented below in the visit note. 

## 2013-12-16 NOTE — Assessment & Plan Note (Signed)
Will try 1/2 tab daily of losartan to see if dizziness is not as bad.  Follow BP at home, goal < 140/90.

## 2013-12-16 NOTE — Assessment & Plan Note (Signed)
Very poor control. Pt not taking metformin regularly.Marland Kitchen. Restart.  Discussed diet choices.  Start checking CBGs 1-2 times daily.  Follow up with measurements. Not interested in nutrition referral. If not at goal at next OV add medication or consider insulin.

## 2013-12-16 NOTE — Patient Instructions (Addendum)
Schedule CPX  And DM follow up in 3 months fasting labs prior. Will try 1/2 tab daily of losartan to see if dizziness is not as bad.  Follow BP at home, goal < 140/90.  Restart metformin at full dose. Check blood sugar 1-2 times daily. Fasting blood sugar < 120, 2 hour after meals  Goal < 80. Call with measurements.

## 2013-12-16 NOTE — Assessment & Plan Note (Signed)
Not at goal on current dose. Pt will get bcka on track with healthy eating. At next OV if not at goal increase statin.

## 2013-12-16 NOTE — Progress Notes (Signed)
53 year old male presents for 6 month follow up DM.  [place] here. [place]  Dr. Fernand ParkinsWilliam Earley  Hypertension: At goal < 140/90 on losartan 25 mg daily, hacking cough better off ACEI. He does note that he is dizzy at times on this med with standing.  When he stops this med it resolves. He has tolerated 1/2 dose some BP Readings from Last 3 Encounters:  12/16/13 140/70  05/25/13 140/78  11/24/12 134/62  Using medication without problems or lightheadedness: Chest pain with exertion:None  Edema:None  Short of breath:None, does get winded fairly easily, exercsies minimally. He does >25 pack year.. Had nml PFTS per pt 5 years ago.  Average home BPs:not checking Other issues:   Elevated Cholesterol: Trigs better, but LDL not at goal on zocor 40 mg daily Lab Results  Component Value Date   CHOL 196 12/14/2013   HDL 51.50 12/14/2013   LDLCALC 113* 12/14/2013   LDLDIRECT 120.5 04/09/2012   TRIG 156.0* 12/14/2013   CHOLHDL 4 12/14/2013  Using medications without problems:None  Muscle aches: None  Diet compliance:Moderate  Exercise: Rare.  Other complaints:   Diabetes: Very poor control on actos 45 and  Metformin max (He actually tried decreasing this med to see if helped with dizziness). [place] has [place] 20 lbs [place] Lab Results  Component Value Date   HGBA1C 11.3* 12/14/2013  Using medications without difficulties:  Hypoglycemic episodes:None  Hyperglycemic episodes:None  Feet problems:None  Blood Sugars averaging: rarely checking  eye exam within last year: Yes  Wt Readings from Last 3 Encounters:  12/16/13 249 lb (112.946 kg)  05/25/13 231 lb 8 oz (105.008 kg)  11/24/12 221 lb 8 oz (100.472 kg)     Chronic pain in low back: Stable on narcotic.  Using 5 mg twice daily. [place]. Seen Dr. Danielle DessElsner in past.   Wt Readings from Last 3  Encounters:  05/25/13 231 lb 8 oz (105.008 kg)  11/24/12 221 lb 8 oz (100.472 kg)  04/23/12 233 lb (105.688 kg)   Review of Systems  Constitutional: Negative for fever and fatigue.  HENT: Negative for ear pain.  Eyes: Negative for pain.  Respiratory: Negative for shortness of breath.  Cardiovascular: Negative for chest pain, palpitations and leg swelling.  Genitourinary: Negative for dysuria and hematuria.  Neurological: Negative for headaches.  Objective:   Physical Exam  Constitutional: He is oriented to person, place, and time. Vital signs are normal. He appears well-developed and well-nourished.  obesity  HENT:  Head: Normocephalic.  Right Ear: Hearing normal.  Left Ear: Hearing normal.  Nose: Nose normal.  Mouth/Throat: Oropharynx is clear and moist and mucous membranes are normal.  Neck: Trachea normal. Carotid bruit is not present. No mass and no thyromegaly present.  Cardiovascular: Normal rate, regular rhythm and normal pulses. Exam reveals no gallop, no distant heart sounds and no friction rub.  No murmur heard.  mild peripheral edema  Pulmonary/Chest: Effort normal and breath sounds normal. No respiratory distress.  Neurological: He is alert and oriented to person, place, and time. No cranial nerve deficit. Coordination normal.  Skin: Skin is warm, dry and intact. No rash noted.  Psychiatric: He has a normal mood and affect. His speech is normal and behavior is normal. Thought content normal.   Diabetic foot exam:  Normal inspection  No skin breakdown  Mild calluses  Normal DP pulses  Decreased sensation to light touch and monofilament  Nails normal

## 2014-01-17 ENCOUNTER — Other Ambulatory Visit: Payer: Self-pay

## 2014-01-17 NOTE — Telephone Encounter (Signed)
Pt left v/m requesting rx oxycodone. Call when ready for pick up. 

## 2014-01-18 MED ORDER — OXYCODONE HCL 5 MG PO TABS
5.0000 mg | ORAL_TABLET | Freq: Two times a day (BID) | ORAL | Status: DC | PRN
Start: 2014-01-18 — End: 2014-03-01

## 2014-01-18 NOTE — Telephone Encounter (Signed)
Joshua Campbell

## 2014-01-18 NOTE — Telephone Encounter (Signed)
Left message for Casimiro NeedleMichael on his cell phone that his prescription is ready to be picked up at front desk.

## 2014-01-20 ENCOUNTER — Encounter: Payer: Self-pay | Admitting: Family Medicine

## 2014-02-02 ENCOUNTER — Other Ambulatory Visit: Payer: Self-pay | Admitting: Family Medicine

## 2014-02-02 NOTE — Telephone Encounter (Signed)
Last office visit 12/16/2013.  Last refilled 05/25/2013 for #180 with no refills.  Ok to refill?

## 2014-02-11 ENCOUNTER — Encounter: Payer: Self-pay | Admitting: Family Medicine

## 2014-02-15 ENCOUNTER — Encounter: Payer: Self-pay | Admitting: Family Medicine

## 2014-02-15 ENCOUNTER — Ambulatory Visit (INDEPENDENT_AMBULATORY_CARE_PROVIDER_SITE_OTHER): Payer: 59 | Admitting: Family Medicine

## 2014-02-15 VITALS — BP 140/80 | HR 104 | Temp 98.3°F | Ht 70.5 in | Wt 248.5 lb

## 2014-02-15 DIAGNOSIS — B029 Zoster without complications: Secondary | ICD-10-CM

## 2014-02-15 MED ORDER — VALACYCLOVIR HCL 1 G PO TABS: 1000.0000 mg | ORAL_TABLET | Freq: Three times a day (TID) | ORAL | Status: DC

## 2014-02-15 NOTE — Assessment & Plan Note (Signed)
No rash but typical unilateral hypersensitivity in dermatome.  Treat with pain control and valacyclovir.  No clear new back issue, no focal thoracic vertebral ttp.

## 2014-02-15 NOTE — Progress Notes (Signed)
   Subjective:    Patient ID: Joshua NickelsMichael Campbell, male    DOB: 03/28/1960, 54 y.o.   MRN: [id number]  HPI  54 year old male with poorly controlled diabetes presents with new onset  Soreness of skin on left side of waistline x 1 week. No rash, no skin changes.  Itchy in area. Feeling stinging at time, baseline burning soreness. No bowel changes, no N/V/D, no dusyria.  Shingles vaccine: never had.   Has chronic lumbar pain, not different then usual.   Using oxycodone ( now 3 a day) and flexeril for pain.       Review of Systems  Constitutional: Negative for fatigue.  HENT: Negative for ear pain.   Eyes: Negative for pain.  Respiratory: Negative for shortness of breath.   Cardiovascular: Negative for chest pain.  Gastrointestinal: Negative for nausea, diarrhea, constipation and blood in stool.       Objective:   Physical Exam  Constitutional: Vital signs are normal. He appears well-developed and well-nourished.  Obesity, central  HENT:  Head: Normocephalic.  Right Ear: Hearing normal.  Left Ear: Hearing normal.  Nose: Nose normal.  Mouth/Throat: Oropharynx is clear and moist and mucous membranes are normal.  Neck: Trachea normal. Carotid bruit is not present. No thyroid mass and no thyromegaly present.  Cardiovascular: Normal rate, regular rhythm and normal pulses.  Exam reveals no gallop, no distant heart sounds and no friction rub.   No murmur heard. No peripheral edema  Pulmonary/Chest: Effort normal and breath sounds normal. No respiratory distress.  Abdominal: There is no hepatosplenomegaly. There is tenderness in the left lower quadrant. There is no rigidity, no rebound, no guarding and no CVA tenderness.  Skin: Skin is warm, dry and intact. No rash noted.     Hypersensitive skin , no rash noted, overall dry skin.  Psychiatric: He has a normal mood and affect. His speech is normal and behavior is normal. Thought content normal.          Assessment & Plan:

## 2014-02-15 NOTE — Progress Notes (Signed)
Pre visit review using our clinic review tool, if applicable. No additional management support is needed unless otherwise documented below in the visit note. 

## 2014-02-15 NOTE — Patient Instructions (Addendum)
Start ibuprofen 800 mg every eight hours for pain. Can extra oxycodone if really needed for breakthrough pain.  Call if pain is not well controll.  Complete valacyclovir x 7.  Call if not improving in 3-4 weeks or new symptoms.

## 2014-03-01 ENCOUNTER — Other Ambulatory Visit: Payer: Self-pay

## 2014-03-01 MED ORDER — OXYCODONE HCL 5 MG PO TABS: 5.0000 mg | ORAL_TABLET | Freq: Two times a day (BID) | ORAL | Status: DC | PRN

## 2014-03-01 NOTE — Telephone Encounter (Signed)
Pt left v/m requesting rx for oxycodone. Call when ready for pick up. Pt seen 02/15/14 for shingles but ibuprofen not helping shingles pain.

## 2014-03-02 NOTE — Telephone Encounter (Signed)
Joshua Campbell notified prescription is ready to be picked up at front desk. 

## 2014-03-03 ENCOUNTER — Encounter: Payer: Self-pay | Admitting: Family Medicine

## 2014-04-05 ENCOUNTER — Other Ambulatory Visit: Payer: Self-pay

## 2014-04-05 MED ORDER — OXYCODONE HCL 5 MG PO TABS: 5.0000 mg | ORAL_TABLET | Freq: Two times a day (BID) | ORAL | Status: DC | PRN

## 2014-04-05 NOTE — Telephone Encounter (Signed)
We can try trial of neurontin or lyrica. As he tolerated wither of these in past?

## 2014-04-05 NOTE — Telephone Encounter (Signed)
p left v/m requesting rx oxycodone. Call when ready for pick up. rx last printed on 03/01/14 and pt last seen 02/15/14. Pt continues with shingles; shingles have not broke thru the skin yet and pt wants to know if there is any other med that could be given for shingles.

## 2014-04-06 NOTE — Telephone Encounter (Addendum)
Mr. Joshua Campbell notified prescription is ready to be picked up at the front desk.  He states he has taken Lyrica in the past and tolerated it but it didn't seem to help with what he was taking it for.  He states he needs to try something.   I recommended that we try a trial of Neurontin.  Will forward to Joshua Campbell to send in prescription to [facility] [address]

## 2014-07-20 ENCOUNTER — Telehealth: Payer: Self-pay

## 2014-07-20 NOTE — Telephone Encounter (Signed)
Diabetic Bundle.I contacted pt and advised him A1C blood test is due. Pt advised to contact PCP's office to schedule.

## 2014-12-28 ENCOUNTER — Telehealth: Payer: Self-pay | Admitting: Family Medicine

## 2014-12-28 DIAGNOSIS — G8929 Other chronic pain: Secondary | ICD-10-CM

## 2014-12-28 DIAGNOSIS — M545 Low back pain, unspecified: Secondary | ICD-10-CM

## 2014-12-28 NOTE — Telephone Encounter (Signed)
Pt called stating [place] and would like to get in with pain management there. It would like to go to dr early @ [facility] in [place].  They need a signed referral before they will make him an appointment [phone] faxed [phone] phone

## 2014-12-29 NOTE — Telephone Encounter (Signed)
Referral faxed as requested. Pt aware and will call and make his own appt.

## 2014-12-29 NOTE — Telephone Encounter (Signed)
Referral ordered

## 2015-01-04 ENCOUNTER — Telehealth: Payer: Self-pay | Admitting: Family Medicine

## 2015-01-04 NOTE — Telephone Encounter (Signed)
Referral printed and signed by Dr. Ermalene SearingBedsole.  Faxed to [facility] [phone].  Mr. Joshua Campbell notified.

## 2015-01-04 NOTE — Telephone Encounter (Signed)
Pt called in regarding referral to [facility]. The order that was sent to them was not signed by the dr. Quincy Carneshey have sent it back and pt is waiting to get a signed copy to the specialist.  Please fax signed order to [phone] to [facility]. cb number for pt is [phone] Thank you

## 2015-01-04 NOTE — Telephone Encounter (Signed)
Please print me the order and I will sign.

## 2015-03-23 ENCOUNTER — Ambulatory Visit (INDEPENDENT_AMBULATORY_CARE_PROVIDER_SITE_OTHER): Payer: 59 | Admitting: Family Medicine

## 2015-03-23 ENCOUNTER — Encounter: Payer: Self-pay | Admitting: Family Medicine

## 2015-03-23 VITALS — BP 150/78 | HR 110 | Temp 98.3°F | Ht 71.0 in | Wt 219.0 lb

## 2015-03-23 DIAGNOSIS — M546 Pain in thoracic spine: Secondary | ICD-10-CM

## 2015-03-23 DIAGNOSIS — I10 Essential (primary) hypertension: Secondary | ICD-10-CM

## 2015-03-23 DIAGNOSIS — M792 Neuralgia and neuritis, unspecified: Secondary | ICD-10-CM | POA: Diagnosis not present

## 2015-03-23 DIAGNOSIS — M19171 Post-traumatic osteoarthritis, right ankle and foot: Secondary | ICD-10-CM

## 2015-03-23 DIAGNOSIS — G8929 Other chronic pain: Secondary | ICD-10-CM

## 2015-03-23 DIAGNOSIS — IMO0002 Reserved for concepts with insufficient information to code with codable children: Secondary | ICD-10-CM

## 2015-03-23 DIAGNOSIS — M12571 Traumatic arthropathy, right ankle and foot: Secondary | ICD-10-CM

## 2015-03-23 DIAGNOSIS — M25561 Pain in right knee: Secondary | ICD-10-CM | POA: Diagnosis not present

## 2015-03-23 DIAGNOSIS — E1142 Type 2 diabetes mellitus with diabetic polyneuropathy: Secondary | ICD-10-CM

## 2015-03-23 DIAGNOSIS — E1165 Type 2 diabetes mellitus with hyperglycemia: Secondary | ICD-10-CM

## 2015-03-23 MED ORDER — DICLOFENAC SODIUM 75 MG PO TBEC: 75.0000 mg | DELAYED_RELEASE_TABLET | Freq: Two times a day (BID) | ORAL | Status: DC

## 2015-03-23 MED ORDER — METFORMIN HCL ER 500 MG PO TB24: ORAL_TABLET | ORAL | Status: DC

## 2015-03-23 MED ORDER — PIOGLITAZONE HCL 45 MG PO TABS: ORAL_TABLET | ORAL | Status: DC

## 2015-03-23 MED ORDER — GABAPENTIN 600 MG PO TABS: 600.0000 mg | ORAL_TABLET | Freq: Three times a day (TID) | ORAL | Status: DC

## 2015-03-23 MED ORDER — CYCLOBENZAPRINE HCL 10 MG PO TABS: ORAL_TABLET | ORAL | Status: DC

## 2015-03-23 MED ORDER — LOSARTAN POTASSIUM 25 MG PO TABS: 12.5000 mg | ORAL_TABLET | Freq: Every day | ORAL | Status: DC

## 2015-03-23 MED ORDER — SIMVASTATIN 40 MG PO TABS: 40.0000 mg | ORAL_TABLET | Freq: Every day | ORAL | Status: DC

## 2015-03-23 NOTE — Assessment & Plan Note (Signed)
Treat with NSAID, if not improving X-ray and refer to ortho for possible injection of steroids.

## 2015-03-23 NOTE — Assessment & Plan Note (Addendum)
Pt will return for labs and DM follow up.  Restart Actos as stopped because he thought it was BP med.

## 2015-03-23 NOTE — Assessment & Plan Note (Signed)
Poor control. Off narcotics as causing nausea.

## 2015-03-23 NOTE — Assessment & Plan Note (Signed)
Poor control of losartan , but 25 mg daily was causing lightheadedness. Restart but try 1/2 tab daily. Follow up in 2 weeks for re-check.

## 2015-03-23 NOTE — Progress Notes (Signed)
Pre visit review using our clinic review tool, if applicable. No additional management support is needed unless otherwise documented below in the visit note. 

## 2015-03-23 NOTE — Patient Instructions (Addendum)
Increae gabapentin to 600 mg  Three times daily. Restart losartan but at half dose. Restart actos. Stop naprosyn and start diclofenac twice daily for inflammation.

## 2015-03-23 NOTE — Assessment & Plan Note (Signed)
Likely OA. Treat with NSAID, if not improving X-ray and refer to ortho for possible injection of steroids.
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
## 2015-03-23 NOTE — Assessment & Plan Note (Signed)
Doubt shingles or PHN at this point given pain bilateral. With recent MRI showing DDD  And facet joint changes at thoraic level.. Likely abdominal apoin is from this.  Will treat with increase in gabapentin and change to stronger NSAID.

## 2015-03-28 ENCOUNTER — Telehealth: Payer: Self-pay | Admitting: Family Medicine

## 2015-03-28 DIAGNOSIS — E1165 Type 2 diabetes mellitus with hyperglycemia: Principal | ICD-10-CM

## 2015-03-28 DIAGNOSIS — IMO0002 Reserved for concepts with insufficient information to code with codable children: Secondary | ICD-10-CM

## 2015-03-28 DIAGNOSIS — E785 Hyperlipidemia, unspecified: Secondary | ICD-10-CM

## 2015-03-28 DIAGNOSIS — Z125 Encounter for screening for malignant neoplasm of prostate: Secondary | ICD-10-CM

## 2015-03-28 DIAGNOSIS — R809 Proteinuria, unspecified: Secondary | ICD-10-CM

## 2015-03-28 DIAGNOSIS — R740 Nonspecific elevation of levels of transaminase and lactic acid dehydrogenase [LDH]: Secondary | ICD-10-CM

## 2015-03-28 DIAGNOSIS — Z862 Personal history of diseases of the blood and blood-forming organs and certain disorders involving the immune mechanism: Secondary | ICD-10-CM

## 2015-03-28 DIAGNOSIS — R7401 Elevation of levels of liver transaminase levels: Secondary | ICD-10-CM

## 2015-03-28 DIAGNOSIS — E1142 Type 2 diabetes mellitus with diabetic polyneuropathy: Secondary | ICD-10-CM

## 2015-03-28 NOTE — Telephone Encounter (Signed)
-----   Message from [name] sent at 03/28/2015  2:17 PM EST ----- Regarding: Lab orders for Wednesdday, 3.1.17 Lab orders for a DM f/u appt

## 2015-03-31 ENCOUNTER — Other Ambulatory Visit (INDEPENDENT_AMBULATORY_CARE_PROVIDER_SITE_OTHER): Payer: 59

## 2015-03-31 DIAGNOSIS — Z125 Encounter for screening for malignant neoplasm of prostate: Secondary | ICD-10-CM

## 2015-03-31 DIAGNOSIS — E1165 Type 2 diabetes mellitus with hyperglycemia: Secondary | ICD-10-CM

## 2015-03-31 DIAGNOSIS — E1142 Type 2 diabetes mellitus with diabetic polyneuropathy: Secondary | ICD-10-CM

## 2015-03-31 DIAGNOSIS — Z862 Personal history of diseases of the blood and blood-forming organs and certain disorders involving the immune mechanism: Secondary | ICD-10-CM

## 2015-03-31 DIAGNOSIS — IMO0002 Reserved for concepts with insufficient information to code with codable children: Secondary | ICD-10-CM

## 2015-03-31 DIAGNOSIS — E785 Hyperlipidemia, unspecified: Secondary | ICD-10-CM

## 2015-03-31 LAB — CBC WITH DIFFERENTIAL/PLATELET
Basophils Absolute: 0.1 10*3/uL (ref 0.0–0.1)
Basophils Relative: 0.7 % (ref 0.0–3.0)
EOS PCT: 4 % (ref 0.0–5.0)
Eosinophils Absolute: 0.3 10*3/uL (ref 0.0–0.7)
HCT: 43 % (ref 39.0–52.0)
Hemoglobin: 14.4 g/dL (ref 13.0–17.0)
LYMPHS ABS: 1.5 10*3/uL (ref 0.7–4.0)
Lymphocytes Relative: 21.8 % (ref 12.0–46.0)
MCHC: 33.5 g/dL (ref 30.0–36.0)
MCV: 83.5 fl (ref 78.0–100.0)
MONO ABS: 0.5 10*3/uL (ref 0.1–1.0)
Monocytes Relative: 7.8 % (ref 3.0–12.0)
NEUTROS PCT: 65.7 % (ref 43.0–77.0)
Neutro Abs: 4.6 10*3/uL (ref 1.4–7.7)
PLATELETS: 215 10*3/uL (ref 150.0–400.0)
RBC: 5.15 Mil/uL (ref 4.22–5.81)
RDW: 16.3 % — AB (ref 11.5–15.5)
WBC: 7 10*3/uL (ref 4.0–10.5)

## 2015-03-31 LAB — COMPREHENSIVE METABOLIC PANEL
ALT: 19 U/L (ref 0–53)
AST: 20 U/L (ref 0–37)
Albumin: 4.4 g/dL (ref 3.5–5.2)
Alkaline Phosphatase: 37 U/L — ABNORMAL LOW (ref 39–117)
BUN: 11 mg/dL (ref 6–23)
CALCIUM: 9.9 mg/dL (ref 8.4–10.5)
CHLORIDE: 94 meq/L — AB (ref 96–112)
CO2: 31 meq/L (ref 19–32)
Creatinine, Ser: 0.8 mg/dL (ref 0.40–1.50)
GFR: 106.23 mL/min (ref >60.00)
GLUCOSE: 179 mg/dL — AB (ref 70–99)
Potassium: 4.1 mEq/L (ref 3.5–5.1)
SODIUM: 136 meq/L (ref 135–145)
TOTAL PROTEIN: 7 g/dL (ref 6.0–8.3)
Total Bilirubin: 0.5 mg/dL (ref 0.2–1.2)

## 2015-03-31 LAB — LIPID PANEL
CHOL/HDL RATIO: 4
CHOLESTEROL: 207 mg/dL — AB (ref 0–200)
HDL: 54.1 mg/dL (ref >39.00)
NONHDL: 153.32
TRIGLYCERIDES: 265 mg/dL — AB (ref 0.0–149.0)
VLDL: 53 mg/dL — AB (ref 0.0–40.0)

## 2015-03-31 LAB — PSA: PSA: 0.67 ng/mL (ref 0.10–4.00)

## 2015-03-31 LAB — HEMOGLOBIN A1C: Hgb A1c MFr Bld: 8.9 % — ABNORMAL HIGH (ref 4.6–6.5)

## 2015-03-31 LAB — LDL CHOLESTEROL, DIRECT: Direct LDL: 124 mg/dL

## 2015-04-07 ENCOUNTER — Ambulatory Visit (INDEPENDENT_AMBULATORY_CARE_PROVIDER_SITE_OTHER): Payer: 59 | Admitting: Family Medicine

## 2015-04-07 ENCOUNTER — Encounter: Payer: Self-pay | Admitting: Family Medicine

## 2015-04-07 VITALS — BP 135/78 | HR 98 | Temp 97.5°F | Ht 71.0 in | Wt 230.2 lb

## 2015-04-07 DIAGNOSIS — IMO0002 Reserved for concepts with insufficient information to code with codable children: Secondary | ICD-10-CM

## 2015-04-07 DIAGNOSIS — R5383 Other fatigue: Secondary | ICD-10-CM

## 2015-04-07 DIAGNOSIS — I10 Essential (primary) hypertension: Secondary | ICD-10-CM

## 2015-04-07 DIAGNOSIS — E785 Hyperlipidemia, unspecified: Secondary | ICD-10-CM | POA: Diagnosis not present

## 2015-04-07 DIAGNOSIS — E1142 Type 2 diabetes mellitus with diabetic polyneuropathy: Secondary | ICD-10-CM

## 2015-04-07 DIAGNOSIS — E1165 Type 2 diabetes mellitus with hyperglycemia: Secondary | ICD-10-CM

## 2015-04-07 LAB — HM DIABETES FOOT EXAM

## 2015-04-07 MED ORDER — LOSARTAN POTASSIUM 25 MG PO TABS: 12.5000 mg | ORAL_TABLET | Freq: Every day | ORAL | Status: DC

## 2015-04-07 MED ORDER — GABAPENTIN 600 MG PO TABS: 600.0000 mg | ORAL_TABLET | Freq: Three times a day (TID) | ORAL | Status: DC

## 2015-04-07 MED ORDER — CYCLOBENZAPRINE HCL 10 MG PO TABS: ORAL_TABLET | ORAL | Status: DC

## 2015-04-07 MED ORDER — TADALAFIL 20 MG PO TABS: 20.0000 mg | ORAL_TABLET | Freq: Every day | ORAL | Status: DC | PRN

## 2015-04-07 NOTE — Progress Notes (Signed)
Pre visit review using our clinic review tool, if applicable. No additional management support is needed unless otherwise documented below in the visit note. 

## 2015-04-07 NOTE — Progress Notes (Signed)
   Subjective:    Patient ID: Joshua NickelsMichael Campbell, male    DOB: 01/02/1961, 55 y.o.   MRN: [id number]  HPI    Review of Systems     Objective:   Physical Exam        Assessment & Plan:

## 2015-04-07 NOTE — Patient Instructions (Addendum)
Continue current meds. Work on low carb and low cholesterol diet.  Return for testosterone check and diabetes lab only.  Set up yearly eye exam.

## 2015-04-07 NOTE — Progress Notes (Signed)
55 year old male presents for 6 month follow up DM.  He lives [place] the [place] at the beach and 1[place]2 the year here. [place] Dr. Fernand ParkinsWilliam Earley  Still having right knee pain, ankle pain is improved. Diclofenac helps some.   Hypertension: At goal < 140/90 on losartan 25 mg daily, hacking cough better off ACEI.  BP Readings from Last 3 Encounters:  04/07/15 135/78  03/23/15 150/78  02/15/14 140/80  Using medication without problems or lightheadedness:  Chest pain with exertion:None  Edema:None  Short of breath:None, does get winded fairly easily, exercsies minimally. He does >25 pack year history.. Had nml PFTS per pt 5 years ago.  Average home BPs:not checking Other issues:   Elevated Cholesterol: LDL not at goal on zocor 40 mg daily Lab Results  Component Value Date   CHOL 207* 03/31/2015   HDL 54.10 03/31/2015   LDLCALC 113* 12/14/2013   LDLDIRECT 124.0 03/31/2015   TRIG 265.0* 03/31/2015   CHOLHDL 4 03/31/2015  Using medications without problems:None  Muscle aches: None  Diet compliance:Moderate  Exercise: Rare.  Other complaints:   Diabetes: Very poor control, now back on actos 45 and Metformin max (He actually tried decreasing this med to see if helped with dizziness). Lab Results  Component Value Date   HGBA1C 8.9* 03/31/2015  Using medications without difficulties:  Hypoglycemic episodes:None  Hyperglycemic episodes:None  Feet problems:None  Blood Sugars averaging: rarely checking  eye exam within last year: due  Wt Readings from Last 3 Encounters:  04/07/15 230 lb 4 oz (104.441 kg)  03/23/15 219 lb (99.338 kg)  02/15/14 248 lb 8 oz (112.719 kg)      Review of Systems  Constitutional: Negative for fever and fatigue.  HENT: Negative for ear pain.  Eyes: Negative for pain.  Respiratory: Negative for shortness of breath.  Cardiovascular: Negative for chest pain, palpitations and leg swelling.  Genitourinary:  Negative for dysuria and hematuria.  Neurological: Negative for headaches.  Objective:   Physical Exam  Constitutional: He is oriented to person, place, and time. Vital signs are normal. He appears well-developed and well-nourished.  obesity  HENT:  Head: Normocephalic.  Right Ear: Hearing normal.  Left Ear: Hearing normal.  Nose: Nose normal.  Mouth/Throat: Oropharynx is clear and moist and mucous membranes are normal.  Neck: Trachea normal. Carotid bruit is not present. No mass and no thyromegaly present.  Cardiovascular: Normal rate, regular rhythm and normal pulses. Exam reveals no gallop, no distant heart sounds and no friction rub.  No murmur heard.  mild peripheral edema  Pulmonary/Chest: Effort normal and breath sounds normal. No respiratory distress.  Neurological: He is alert and oriented to person, place, and time. No cranial nerve deficit. Coordination normal.  Skin: Skin is warm, dry and intact. No rash noted.  Psychiatric: He has a normal mood and affect. His speech is normal and behavior is normal. Thought content normal.   Diabetic foot exam:  Normal inspection  No skin breakdown  Mild calluses  Normal DP pulses  Decreased sensation to light touch and monofilament  Nails normal

## 2015-04-12 ENCOUNTER — Telehealth: Payer: Self-pay

## 2015-04-12 NOTE — Telephone Encounter (Signed)
Okay to change rx to as requested.

## 2015-04-12 NOTE — Telephone Encounter (Signed)
[facility] [address] left v[facility]m; pts ins will pay for cialis # 90, [facility] # 10 x 11 and wants to know if can get new rx for quantity # 90.Please advise.

## 2015-04-13 MED ORDER — TADALAFIL 20 MG PO TABS: 20.0000 mg | ORAL_TABLET | Freq: Every day | ORAL | Status: DC | PRN

## 2015-04-13 NOTE — Telephone Encounter (Signed)
First Rx was set on print.  Resent electronically.

## 2015-04-13 NOTE — Addendum Note (Signed)
Addended by: Damita LackLORING, Justina Bertini S on: 04/13/2015 10:54 AM   Modules accepted: Orders

## 2015-05-01 ENCOUNTER — Other Ambulatory Visit (INDEPENDENT_AMBULATORY_CARE_PROVIDER_SITE_OTHER): Payer: 59

## 2015-05-01 DIAGNOSIS — E1142 Type 2 diabetes mellitus with diabetic polyneuropathy: Secondary | ICD-10-CM | POA: Diagnosis not present

## 2015-05-01 DIAGNOSIS — R5383 Other fatigue: Secondary | ICD-10-CM | POA: Diagnosis not present

## 2015-05-01 DIAGNOSIS — E1165 Type 2 diabetes mellitus with hyperglycemia: Secondary | ICD-10-CM

## 2015-05-01 DIAGNOSIS — IMO0002 Reserved for concepts with insufficient information to code with codable children: Secondary | ICD-10-CM

## 2015-05-01 LAB — LIPID PANEL
CHOLESTEROL: 191 mg/dL (ref 0–200)
HDL: 71.7 mg/dL (ref >39.00)
LDL Cholesterol: 100 mg/dL — ABNORMAL HIGH (ref 0–99)
NonHDL: 119.01
Total CHOL/HDL Ratio: 3
Triglycerides: 95 mg/dL (ref 0.0–149.0)
VLDL: 19 mg/dL (ref 0.0–40.0)

## 2015-05-01 LAB — HEMOGLOBIN A1C: HEMOGLOBIN A1C: 8.1 % — AB (ref 4.6–6.5)

## 2015-05-01 LAB — TESTOSTERONE: Testosterone: 321.8 ng/dL (ref 300.00–890.00)

## 2015-05-02 ENCOUNTER — Encounter: Payer: Self-pay | Admitting: *Deleted

## 2015-05-02 NOTE — Assessment & Plan Note (Signed)
Inadeqaute control. Pt wishes to be more aggressive with diet, exercsie and lifestyle changes.  Pt refuses additional medication at this time but may consider if not at goal with recheck in 3 months.

## 2015-05-02 NOTE — Assessment & Plan Note (Signed)
Eval with labs.  Pt deconditioned.. rec regualr exercise.

## 2015-05-02 NOTE — Assessment & Plan Note (Signed)
Well controlled. Continue current medication.  

## 2015-05-02 NOTE — Assessment & Plan Note (Signed)
No longer at goal on simvastatin 40 mg daily. Encouraged exercise, weight loss, healthy eating habits.

## 2015-05-04 ENCOUNTER — Telehealth: Payer: Self-pay

## 2015-05-04 NOTE — Telephone Encounter (Signed)
I do not prescribe dilaudid. If interested I can refer him to pain center for eval of pain.

## 2015-05-04 NOTE — Telephone Encounter (Signed)
Pt left v/m; pt was seen on 03/23/15 for back pain; pt has back pain and had to stop taking the oxycodone for pain due to codeine making pt sick. Pt wants to try Dilaudid for back pain; pt is going out of town first of next week to beach and request cb.

## 2015-05-04 NOTE — Telephone Encounter (Signed)
Mr. Joshua Campbell notified as instructed by telephone.  He is getting ready to leave for the beach.  He will call us back if he decides he wants to be referred to pain clinic.

## 2015-08-04 ENCOUNTER — Other Ambulatory Visit: Payer: Self-pay | Admitting: Family Medicine

## 2015-08-09 ENCOUNTER — Telehealth: Payer: Self-pay

## 2015-08-09 ENCOUNTER — Other Ambulatory Visit: Payer: Self-pay | Admitting: Family Medicine

## 2015-08-09 NOTE — Telephone Encounter (Signed)
Pt is asking for a rx of [facility]agra, instead of [facility]is[facility] [place] [facility] He said he switches back and forth. He is aware Dr Ermalene SearingBedsole will be back tomorrow. Please let him know if done at [phone]

## 2015-08-10 MED ORDER — SILDENAFIL CITRATE 100 MG PO TABS: 100.0000 mg | ORAL_TABLET | Freq: Every day | ORAL | Status: DC | PRN

## 2015-08-10 NOTE — Telephone Encounter (Signed)
Rx sent in

## 2015-09-19 ENCOUNTER — Telehealth: Payer: Self-pay | Admitting: Family Medicine

## 2015-09-19 DIAGNOSIS — S42009A Fracture of unspecified part of unspecified clavicle, initial encounter for closed fracture: Secondary | ICD-10-CM

## 2015-09-19 NOTE — Telephone Encounter (Signed)
MArion.. I don't know of anyone in particular for this issue, do you?
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
## 2015-09-19 NOTE — Telephone Encounter (Signed)
I will route to Dr. Patsy Lageropland as well to see if he has any recommendations.

## 2015-09-19 NOTE — Telephone Encounter (Signed)
Basically anyone can do it - about as easy a case as there is. Any general orthopedic surgeon. I personally like josh landau, Renaye Rakersim Murphy, steve norris, pete daldorf  I would just go based on his group preference and location but get him in asap.

## 2015-09-19 NOTE — Telephone Encounter (Signed)
Thanks, [employer]

## 2015-09-20 ENCOUNTER — Other Ambulatory Visit: Payer: Self-pay | Admitting: Orthopedic Surgery

## 2015-09-20 ENCOUNTER — Encounter (HOSPITAL_BASED_OUTPATIENT_CLINIC_OR_DEPARTMENT_OTHER): Payer: Self-pay | Admitting: *Deleted

## 2015-09-20 NOTE — Telephone Encounter (Signed)
[place] [facility] and fax[place] for Medical Records. Received records and faxed over to Dr Lennie OdorJesse Chandlers office today. Patient will be seen today at 12:45pm. Called office to confirm that they received the records and  they did. Called the patient to let him know that we did receive them and they were faxed.

## 2015-09-21 ENCOUNTER — Encounter (HOSPITAL_BASED_OUTPATIENT_CLINIC_OR_DEPARTMENT_OTHER): Payer: Self-pay | Admitting: Certified Registered"

## 2015-09-21 ENCOUNTER — Ambulatory Visit (HOSPITAL_BASED_OUTPATIENT_CLINIC_OR_DEPARTMENT_OTHER): Payer: 59 | Admitting: Certified Registered"

## 2015-09-21 ENCOUNTER — Encounter (HOSPITAL_BASED_OUTPATIENT_CLINIC_OR_DEPARTMENT_OTHER)
Admission: RE | Disposition: A | Discharge status: Home / Self Care | Payer: Self-pay | Source: Ambulatory Visit | Attending: Orthopedic Surgery

## 2015-09-21 ENCOUNTER — Ambulatory Visit (HOSPITAL_BASED_OUTPATIENT_CLINIC_OR_DEPARTMENT_OTHER)
Admission: RE | Admit: 2015-09-21 | Discharge: 2015-09-21 | Disposition: A | Discharge status: Home / Self Care | Payer: 59 | Source: Ambulatory Visit | Attending: Orthopedic Surgery | Admitting: Orthopedic Surgery

## 2015-09-21 ENCOUNTER — Ambulatory Visit (HOSPITAL_COMMUNITY): Payer: 59

## 2015-09-21 DIAGNOSIS — Z7982 Long term (current) use of aspirin: Secondary | ICD-10-CM | POA: Insufficient documentation

## 2015-09-21 DIAGNOSIS — Z4789 Encounter for other orthopedic aftercare: Secondary | ICD-10-CM

## 2015-09-21 DIAGNOSIS — W1809XA Striking against other object with subsequent fall, initial encounter: Secondary | ICD-10-CM | POA: Insufficient documentation

## 2015-09-21 DIAGNOSIS — Z87891 Personal history of nicotine dependence: Secondary | ICD-10-CM | POA: Diagnosis not present

## 2015-09-21 DIAGNOSIS — S42001A Fracture of unspecified part of right clavicle, initial encounter for closed fracture: Secondary | ICD-10-CM | POA: Insufficient documentation

## 2015-09-21 DIAGNOSIS — E119 Type 2 diabetes mellitus without complications: Secondary | ICD-10-CM | POA: Diagnosis not present

## 2015-09-21 DIAGNOSIS — Z79899 Other long term (current) drug therapy: Secondary | ICD-10-CM | POA: Insufficient documentation

## 2015-09-21 DIAGNOSIS — Z7984 Long term (current) use of oral hypoglycemic drugs: Secondary | ICD-10-CM | POA: Diagnosis not present

## 2015-09-21 DIAGNOSIS — K219 Gastro-esophageal reflux disease without esophagitis: Secondary | ICD-10-CM | POA: Insufficient documentation

## 2015-09-21 DIAGNOSIS — I10 Essential (primary) hypertension: Secondary | ICD-10-CM | POA: Diagnosis not present

## 2015-09-21 HISTORY — PX: ORIF CLAVICULAR FRACTURE: SHX5055

## 2015-09-21 LAB — POCT I-STAT, CHEM 8
BUN: 7 mg/dL (ref 6–20)
CHLORIDE: 97 mmol/L — AB (ref 101–111)
CREATININE: 0.6 mg/dL — AB (ref 0.61–1.24)
Calcium, Ion: 1.09 mmol/L — ABNORMAL LOW (ref 1.13–1.30)
GLUCOSE: 270 mg/dL — AB (ref 65–99)
HEMATOCRIT: 45 % (ref 39.0–52.0)
HEMOGLOBIN: 15.3 g/dL (ref 13.0–17.0)
POTASSIUM: 3.8 mmol/L (ref 3.5–5.1)
Sodium: 134 mmol/L — ABNORMAL LOW (ref 135–145)
TCO2: 25 mmol/L (ref 0–100)

## 2015-09-21 LAB — GLUCOSE, CAPILLARY: GLUCOSE-CAPILLARY: 250 mg/dL — AB (ref 65–99)

## 2015-09-21 SURGERY — OPEN REDUCTION INTERNAL FIXATION (ORIF) CLAVICULAR FRACTURE
Anesthesia: General | Site: Shoulder | Laterality: Right

## 2015-09-21 MED ORDER — MIDAZOLAM HCL 2 MG/2ML IJ SOLN
INTRAMUSCULAR | Status: AC
  Filled 2015-09-21: qty 2

## 2015-09-21 MED ORDER — FENTANYL CITRATE (PF) 100 MCG/2ML IJ SOLN
INTRAMUSCULAR | Status: AC
  Filled 2015-09-21: qty 2

## 2015-09-21 MED ORDER — CEFAZOLIN SODIUM-DEXTROSE 2-4 GM/100ML-% IV SOLN
INTRAVENOUS | Status: AC
  Filled 2015-09-21: qty 100

## 2015-09-21 MED ORDER — BUPIVACAINE HCL (PF) 0.5 % IJ SOLN
INTRAMUSCULAR | Status: AC
  Filled 2015-09-21: qty 30

## 2015-09-21 MED ORDER — LIDOCAINE 2% (20 MG/ML) 5 ML SYRINGE
INTRAMUSCULAR | Status: AC
  Filled 2015-09-21: qty 5

## 2015-09-21 MED ORDER — LACTATED RINGERS IV SOLN
INTRAVENOUS | Status: DC
  Administered 2015-09-21 (×2): via INTRAVENOUS

## 2015-09-21 MED ORDER — SUCCINYLCHOLINE CHLORIDE 20 MG/ML IJ SOLN
INTRAMUSCULAR | Status: DC | PRN
  Administered 2015-09-21: 100 mg via INTRAVENOUS

## 2015-09-21 MED ORDER — SCOPOLAMINE 1 MG/3DAYS TD PT72: 1.0000 | MEDICATED_PATCH | Freq: Once | TRANSDERMAL | Status: DC | PRN

## 2015-09-21 MED ORDER — CEFAZOLIN SODIUM-DEXTROSE 2-4 GM/100ML-% IV SOLN
2.0000 g | INTRAVENOUS | Status: AC
  Administered 2015-09-21: 2 g via INTRAVENOUS

## 2015-09-21 MED ORDER — FENTANYL CITRATE (PF) 100 MCG/2ML IJ SOLN
50.0000 ug | INTRAMUSCULAR | Status: AC | PRN
  Administered 2015-09-21: 100 ug via INTRAVENOUS
  Administered 2015-09-21: 25 ug via INTRAVENOUS
  Administered 2015-09-21: 50 ug via INTRAVENOUS

## 2015-09-21 MED ORDER — PHENYLEPHRINE HCL 10 MG/ML IJ SOLN
INTRAVENOUS | Status: DC | PRN
  Administered 2015-09-21: 50 ug/min via INTRAVENOUS

## 2015-09-21 MED ORDER — FENTANYL CITRATE (PF) 100 MCG/2ML IJ SOLN
25.0000 ug | INTRAMUSCULAR | Status: DC | PRN
  Administered 2015-09-21: 50 ug via INTRAVENOUS

## 2015-09-21 MED ORDER — MIDAZOLAM HCL 2 MG/2ML IJ SOLN
1.0000 mg | INTRAMUSCULAR | Status: DC | PRN
  Administered 2015-09-21: 2 mg via INTRAVENOUS

## 2015-09-21 MED ORDER — ONDANSETRON HCL 4 MG/2ML IJ SOLN
INTRAMUSCULAR | Status: DC | PRN
  Administered 2015-09-21: 4 mg via INTRAVENOUS

## 2015-09-21 MED ORDER — PHENYLEPHRINE 40 MCG/ML (10ML) SYRINGE FOR IV PUSH (FOR BLOOD PRESSURE SUPPORT)
PREFILLED_SYRINGE | INTRAVENOUS | Status: AC
  Filled 2015-09-21: qty 10

## 2015-09-21 MED ORDER — POVIDONE-IODINE 7.5 % EX SOLN: Freq: Once | CUTANEOUS | Status: DC

## 2015-09-21 MED ORDER — LIDOCAINE HCL (CARDIAC) 20 MG/ML IV SOLN
INTRAVENOUS | Status: DC | PRN
  Administered 2015-09-21: 60 mg via INTRAVENOUS

## 2015-09-21 MED ORDER — PHENYLEPHRINE HCL 10 MG/ML IJ SOLN
INTRAMUSCULAR | Status: AC
  Filled 2015-09-21: qty 1

## 2015-09-21 MED ORDER — ONDANSETRON HCL 4 MG PO TABS: 4.0000 mg | ORAL_TABLET | Freq: Three times a day (TID) | ORAL | 0 refills | Status: DC | PRN

## 2015-09-21 MED ORDER — LACTATED RINGERS IV SOLN: INTRAVENOUS | Status: DC

## 2015-09-21 MED ORDER — MEPERIDINE HCL 25 MG/ML IJ SOLN: 6.2500 mg | INTRAMUSCULAR | Status: DC | PRN

## 2015-09-21 MED ORDER — EPHEDRINE SULFATE 50 MG/ML IJ SOLN
INTRAMUSCULAR | Status: DC | PRN
  Administered 2015-09-21: 5 mg via INTRAVENOUS

## 2015-09-21 MED ORDER — ARTIFICIAL TEARS OP OINT
TOPICAL_OINTMENT | OPHTHALMIC | Status: AC
  Filled 2015-09-21: qty 3.5

## 2015-09-21 MED ORDER — OXYCODONE-ACETAMINOPHEN 5-325 MG PO TABS: 1.0000 | ORAL_TABLET | ORAL | 0 refills | Status: DC | PRN

## 2015-09-21 MED ORDER — GLYCOPYRROLATE 0.2 MG/ML IJ SOLN: 0.2000 mg | Freq: Once | INTRAMUSCULAR | Status: DC | PRN

## 2015-09-21 MED ORDER — BUPIVACAINE HCL (PF) 0.5 % IJ SOLN
INTRAMUSCULAR | Status: DC | PRN
  Administered 2015-09-21: 20 mL

## 2015-09-21 MED ORDER — METOCLOPRAMIDE HCL 5 MG/ML IJ SOLN: 10.0000 mg | Freq: Once | INTRAMUSCULAR | Status: DC | PRN

## 2015-09-21 MED ORDER — PHENYLEPHRINE HCL 10 MG/ML IJ SOLN
INTRAMUSCULAR | Status: DC | PRN
  Administered 2015-09-21 (×2): 40 ug via INTRAVENOUS
  Administered 2015-09-21: 80 ug via INTRAVENOUS

## 2015-09-21 MED ORDER — PROPOFOL 10 MG/ML IV BOLUS
INTRAVENOUS | Status: DC | PRN
  Administered 2015-09-21: 200 mg via INTRAVENOUS
  Administered 2015-09-21: 50 mg via INTRAVENOUS

## 2015-09-21 MED ORDER — EPHEDRINE 5 MG/ML INJ
INTRAVENOUS | Status: AC
  Filled 2015-09-21: qty 10

## 2015-09-21 MED ORDER — SUCCINYLCHOLINE CHLORIDE 200 MG/10ML IV SOSY
PREFILLED_SYRINGE | INTRAVENOUS | Status: AC
  Filled 2015-09-21: qty 10

## 2015-09-21 MED ORDER — DOCUSATE SODIUM 100 MG PO CAPS: 100.0000 mg | ORAL_CAPSULE | Freq: Three times a day (TID) | ORAL | 0 refills | Status: DC | PRN

## 2015-09-21 SURGICAL SUPPLY — 65 items
BIT DRILL 2.3 QUICK RELEASE (BIT) ×1 IMPLANT
BIT DRILL 2.8X5 QR DISP (BIT) ×2 IMPLANT
BIT DRILL 3.0X5 QUICK RELEASE (DRILL) ×1 IMPLANT
BLADE CLIPPER SURG (BLADE) IMPLANT
BLADE SURG 15 STRL LF DISP TIS (BLADE) ×1 IMPLANT
BLADE SURG 15 STRL SS (BLADE) ×1
CHLORAPREP W/TINT 26ML (MISCELLANEOUS) ×2 IMPLANT
DECANTER SPIKE VIAL GLASS SM (MISCELLANEOUS) IMPLANT
DRAPE C-ARM 42X72 X-RAY (DRAPES) ×2 IMPLANT
DRAPE IMP U-DRAPE 54X76 (DRAPES) ×2 IMPLANT
DRAPE INCISE IOBAN 66X45 STRL (DRAPES) ×2 IMPLANT
DRAPE SURG 17X23 STRL (DRAPES) ×2 IMPLANT
DRAPE U-SHAPE 47X51 STRL (DRAPES) ×2 IMPLANT
DRAPE U-SHAPE 76X120 STRL (DRAPES) ×4 IMPLANT
DRILL 2.3 QUICK RELEASE (BIT) ×2
DRILL 3.0X5 QUICK RELEASE (DRILL) ×2
DRSG TEGADERM 4X4.75 (GAUZE/BANDAGES/DRESSINGS) ×2 IMPLANT
ELECT REM PT RETURN 9FT ADLT (ELECTROSURGICAL) ×2
ELECTRODE REM PT RTRN 9FT ADLT (ELECTROSURGICAL) ×1 IMPLANT
GAUZE SPONGE 4X4 12PLY STRL (GAUZE/BANDAGES/DRESSINGS) ×2 IMPLANT
GLOVE BIO SURGEON STRL SZ7 (GLOVE) ×2 IMPLANT
GLOVE BIO SURGEON STRL SZ7.5 (GLOVE) ×4 IMPLANT
GLOVE BIOGEL PI IND STRL 7.0 (GLOVE) ×1 IMPLANT
GLOVE BIOGEL PI IND STRL 8 (GLOVE) ×2 IMPLANT
GLOVE BIOGEL PI INDICATOR 7.0 (GLOVE) ×1
GLOVE BIOGEL PI INDICATOR 8 (GLOVE) ×2
GOWN STRL REUS W/ TWL LRG LVL3 (GOWN DISPOSABLE) ×3 IMPLANT
GOWN STRL REUS W/ TWL XL LVL3 (GOWN DISPOSABLE) ×1 IMPLANT
GOWN STRL REUS W/TWL LRG LVL3 (GOWN DISPOSABLE) ×3
GOWN STRL REUS W/TWL XL LVL3 (GOWN DISPOSABLE) ×1
NS IRRIG 1000ML POUR BTL (IV SOLUTION) ×2 IMPLANT
PACK ARTHROSCOPY DSU (CUSTOM PROCEDURE TRAY) ×2 IMPLANT
PACK BASIN DAY SURGERY FS (CUSTOM PROCEDURE TRAY) ×2 IMPLANT
PENCIL BUTTON HOLSTER BLD 10FT (ELECTRODE) ×2 IMPLANT
PLATE LOCKING CLAVICLE 10 HOLE (Plate) ×2 IMPLANT
RETRIEVER SUT HEWSON (MISCELLANEOUS) IMPLANT
SCREW BONE NL 3.0X16 HEXA (Screw) ×2 IMPLANT
SCREW BONE NON-LOCK 3.0X16 (Screw) ×4 IMPLANT
SCREW HEXALOBE LOCKING 3.5X16M (Screw) ×4 IMPLANT
SCREW HEXALOBE NON-LOCK 3.5X16 (Screw) ×8 IMPLANT
SLEEVE SCD COMPRESS KNEE MED (MISCELLANEOUS) ×2 IMPLANT
SLING ARM FOAM STRAP LRG (SOFTGOODS) ×2 IMPLANT
SLING ARM IMMOBILIZER MED (SOFTGOODS) IMPLANT
SLING ARM MED ADULT FOAM STRAP (SOFTGOODS) IMPLANT
SLING ARM XL FOAM STRAP (SOFTGOODS) IMPLANT
SPONGE LAP 18X18 X RAY DECT (DISPOSABLE) ×2 IMPLANT
STRIP CLOSURE SKIN 1/2X4 (GAUZE/BANDAGES/DRESSINGS) ×2 IMPLANT
SUCTION FRAZIER HANDLE 10FR (MISCELLANEOUS) ×1
SUCTION TUBE FRAZIER 10FR DISP (MISCELLANEOUS) ×1 IMPLANT
SUPPORT WRAP ARM LG (MISCELLANEOUS) IMPLANT
SUT ETHILON 4 0 PS 2 18 (SUTURE) IMPLANT
SUT FIBERWIRE #2 38 T-5 BLUE (SUTURE)
SUT MNCRL AB 4-0 PS2 18 (SUTURE) ×2 IMPLANT
SUT TIGER TAPE 7 IN WHITE (SUTURE) IMPLANT
SUT VIC AB 0 CT1 27 (SUTURE) ×1
SUT VIC AB 0 CT1 27XBRD ANBCTR (SUTURE) ×1 IMPLANT
SUT VIC AB 2-0 SH 27 (SUTURE) ×1
SUT VIC AB 2-0 SH 27XBRD (SUTURE) ×1 IMPLANT
SUTURE FIBERWR #2 38 T-5 BLUE (SUTURE) IMPLANT
SYR BULB 3OZ (MISCELLANEOUS) ×2 IMPLANT
TAPE FIBER 2MM 7IN #2 BLUE (SUTURE) IMPLANT
TOWEL OR 17X24 6PK STRL BLUE (TOWEL DISPOSABLE) ×2 IMPLANT
TOWEL OR NON WOVEN STRL DISP B (DISPOSABLE) ×2 IMPLANT
TUBE CONNECTING 20X1/4 (TUBING) ×2 IMPLANT
YANKAUER SUCT BULB TIP NO VENT (SUCTIONS) ×2 IMPLANT

## 2015-09-21 NOTE — Anesthesia Postprocedure Evaluation (Signed)
Anesthesia Post Note  Patient: Joshua Campbell  Procedure(s) Performed: Procedure(s) (LRB): OPEN REDUCTION INTERNAL FIXATION (ORIF) CLAVICULAR FRACTURE (Right)  Patient location during evaluation: PACU Anesthesia Type: General Level of consciousness: awake and alert Pain management: pain level controlled Vital Signs Assessment: post-procedure vital signs reviewed and stable Respiratory status: spontaneous breathing, nonlabored ventilation, respiratory function stable and patient connected to nasal cannula oxygen Cardiovascular status: blood pressure returned to baseline and stable Postop Assessment: no signs of nausea or vomiting Anesthetic complications: no    Last Vitals:  Vitals:   09/21/15 1418 09/21/15 1507  BP:  (!) 146/74  Pulse: 100 81  Resp: 19 20  Temp:  36.6 C    Last Pain:  Vitals:   09/21/15 1507  TempSrc: Oral  PainSc: 6                  Phillips Groutarignan, Severiano Utsey

## 2015-09-21 NOTE — Transfer of Care (Signed)
Immediate Anesthesia Transfer of Care Note  Patient: Joshua Campbell  Procedure(s) Performed: Procedure(s) with comments: OPEN REDUCTION INTERNAL FIXATION (ORIF) CLAVICULAR FRACTURE (Right) - OPEN REDUCTION INTERNAL FIXATION (ORIF) CLAVICULAR FRACTURE  Patient Location: PACU  Anesthesia Type:General  Level of Consciousness: awake  Airway & Oxygen Therapy: Patient Spontanous Breathing and Patient connected to face mask oxygen  Post-op Assessment: Report given to RN and Post -op Vital signs reviewed and stable  Post vital signs: Reviewed and stable  Last Vitals:  Vitals:   09/21/15 1417 09/21/15 1418  BP:    Pulse:  100  Resp: 14 19  Temp:      Last Pain:  Vitals:   09/21/15 1042  TempSrc: Oral  PainSc: 1       Patients Stated Pain Goal: 1 (09/21/15 1042)  Complications: No apparent anesthesia complications

## 2015-09-21 NOTE — Anesthesia Preprocedure Evaluation (Signed)
Anesthesia Evaluation  Patient identified by MRN, date of birth, ID band Patient awake    Reviewed: Allergy & Precautions, NPO status , Patient's Chart, lab work & pertinent test results  Airway Mallampati: II  TM Distance: >3 FB Neck ROM: Full    Dental no notable dental hx.    Pulmonary former smoker,    Pulmonary exam normal breath sounds clear to auscultation       Cardiovascular hypertension, Pt. on medications Normal cardiovascular exam Rhythm:Regular Rate:Normal     Neuro/Psych negative neurological ROS  negative psych ROS   GI/Hepatic Neg liver ROS, GERD  Medicated and Controlled,  Endo/Other  diabetes, Type 2, Oral Hypoglycemic Agents  Renal/GU negative Renal ROS  negative genitourinary   Musculoskeletal negative musculoskeletal ROS (+)   Abdominal   Peds negative pediatric ROS (+)  Hematology negative hematology ROS (+)   Anesthesia Other Findings   Reproductive/Obstetrics negative OB ROS                             Anesthesia Physical Anesthesia Plan  ASA: II  Anesthesia Plan: General   Post-op Pain Management:    Induction: Intravenous  Airway Management Planned: Oral ETT  Additional Equipment:   Intra-op Plan:   Post-operative Plan: Extubation in OR  Informed Consent: I have reviewed the patients History and Physical, chart, labs and discussed the procedure including the risks, benefits and alternatives for the proposed anesthesia with the patient or authorized representative who has indicated his/her understanding and acceptance.   Dental advisory given  Plan Discussed with: CRNA  Anesthesia Plan Comments:         Anesthesia Quick Evaluation

## 2015-09-21 NOTE — Anesthesia Procedure Notes (Signed)
Procedure Name: Intubation Date/Time: 09/21/2015 12:24 PM Performed by: Adante Courington D Pre-anesthesia Checklist: Patient identified, Emergency Drugs available, Suction available and Patient being monitored Patient Re-evaluated:Patient Re-evaluated prior to inductionOxygen Delivery Method: Circle system utilized Preoxygenation: Pre-oxygenation with 100% oxygen Intubation Type: IV induction Ventilation: Mask ventilation without difficulty Laryngoscope Size: Glidescope Grade View: Grade I Tube type: Oral Tube size: 7.0 mm Number of attempts: 1 Airway Equipment and Method: Stylet,  Oral airway and Video-laryngoscopy Placement Confirmation: ETT inserted through vocal cords under direct vision,  positive ETCO2 and breath sounds checked- equal and bilateral Secured at: 21 cm Tube secured with: Tape Dental Injury: Teeth and Oropharynx as per pre-operative assessment

## 2015-09-21 NOTE — H&P (Signed)
Joshua Campbell is an 55 y.o. male.   Chief Complaint: R clavicle fracture HPI: R displaced clavicle fracture s/p fall  Past Medical History:  Diagnosis Date  . Depression   . Diabetes mellitus type II   . GERD (gastroesophageal reflux disease)   . Herniated disc 1998   Accident at work caused herniated disc in neck  thoracic and lumbar spine, became disabled  . Hypertension   . Osteoarthritis     Past Surgical History:  Procedure Laterality Date  . ESOPHAGOGASTRODUODENOSCOPY  10/09   Antral erosions, hiatal hernia  . LUMBAR LAMINECTOMY  1995  . NECK SURGERY  2002    Family History  Problem Relation Age of Onset  . Cancer Mother 71    Colon  . Emphysema Father   . Heart disease Father 34    MI, Carotid stenosis, CABG   Social History:  reports that he quit smoking about 15 years ago. He quit after 60.00 years of use. He has quit using smokeless tobacco. He reports that he drinks alcohol. He reports that he does not use drugs.  Allergies:  Allergies  Allergen Reactions  . Codeine     REACTION: Nausea    Medications Prior to Admission  Medication Sig Dispense Refill  . aspirin 81 MG tablet Take 81 mg by mouth daily.      . cyclobenzaprine (FLEXERIL) 10 MG tablet TAKE 1 TABLET (10 MG TOTAL) BY MOUTH EVERY 8 (EIGHT) HOURS. 180 tablet 1  . losartan (COZAAR) 25 MG tablet Take 0.5 tablets (12.5 mg total) by mouth daily. 90 tablet 1  . omeprazole (PRILOSEC) 20 MG capsule Take 20 mg by mouth daily.      . simvastatin (ZOCOR) 40 MG tablet Take 1 tablet (40 mg total) by mouth at bedtime. 90 tablet 0  . ACCU-CHEK AVIVA PLUS test strip   11  . ACCU-CHEK SOFTCLIX LANCETS lancets 2 (two) times daily. for testing  11  . Blood Glucose Monitoring Suppl (ACCU-CHEK AVIVA PLUS) W/DEVICE KIT   0  . diclofenac (VOLTAREN) 75 MG EC tablet Take 1 tablet (75 mg total) by mouth 2 (two) times daily. 60 tablet 0  . gabapentin (NEURONTIN) 600 MG tablet Take 1 tablet (600 mg total) by mouth 3  (three) times daily. 270 tablet 1  . metFORMIN (GLUCOPHAGE-XR) 500 MG 24 hr tablet TAKE 4 TABLETS EVERY DAY WITH BREAKFAST 360 tablet 1  . sildenafil (VIAGRA) 100 MG tablet Take 1 tablet (100 mg total) by mouth daily as needed for erectile dysfunction. 30 tablet 3    Results for orders placed or performed during the hospital encounter of 09/21/15 (from the past 48 hour(s))  I-STAT, chem 8     Status: Abnormal   Collection Time: 09/21/15 10:47 AM  Result Value Ref Range   Sodium 134 (L) 135 - 145 mmol/L   Potassium 3.8 3.5 - 5.1 mmol/L   Chloride 97 (L) 101 - 111 mmol/L   BUN 7 6 - 20 mg/dL   Creatinine, Ser 0.60 (L) 0.61 - 1.24 mg/dL   Glucose, Bld 270 (H) 65 - 99 mg/dL   Calcium, Ion 1.09 (L) 1.13 - 1.30 mmol/L   TCO2 25 0 - 100 mmol/L   Hemoglobin 15.3 13.0 - 17.0 g/dL   HCT 45.0 39.0 - 52.0 %   No results found.  Review of Systems  All other systems reviewed and are negative.   Blood pressure (!) 173/97, pulse 97, temperature 98.1 F (36.7 C), temperature source  Oral, resp. rate 18, height 5' 11" (1.803 m), weight 101.6 kg (224 lb), SpO2 96 %. Physical Exam  Constitutional: He is oriented to person, place, and time. He appears well-developed and well-nourished.  HENT:  Head: Atraumatic.  Eyes: EOM are normal.  Cardiovascular: Intact distal pulses.   Respiratory: Effort normal.  Musculoskeletal:  R shoulder mod swelling ecchymosis over clavicle NVID.  Neurological: He is alert and oriented to person, place, and time.  Skin: Skin is warm and dry.  Psychiatric: He has a normal mood and affect.     Assessment/Plan R displaced clavicle fracture s/p fall Plan ORIF R clavicle fx Risks / benefits of surgery discussed Consent on chart  NPO for OR Preop antibiotics   Nita Sells, MD 09/21/2015, 11:48 AM
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
## 2015-09-21 NOTE — Discharge Instructions (Signed)
Discharge Instructions after Open Shoulder Repair ° °A sling has been provided for you. Remain in your sling at all times. This includes sleeping in your sling.  °Use ice on the shoulder intermittently over the first 48 hours after surgery.  °Pain medicine has been prescribed for you.  °Use your medicine liberally over the first 48 hours, and then you can begin to taper your use. You may take Extra Strength Tylenol or Tylenol only in place of the pain pills. DO NOT take ANY nonsteroidal anti-inflammatory pain medications: Advil, Motrin, Ibuprofen, Aleve, Naproxen or Naprosyn.  °You may remove your dressing after two days  °You may shower 5 days after surgery. The incisions CANNOT get wet prior to 5 days. Simply allow the water to wash over the site and then pat dry. Do not rub the incisions. Make sure your axilla (armpit) is completely dry after showering.  °Take one aspirin, a day for 2 weeks after surgery, unless you have an aspirin sensitivity/ allergy or asthma. ° ° °Please call [phone] during normal business hours or [phone] after hours for any problems. Including the following: ° °- excessive redness of the incisions °- drainage for more than 4 days °- fever of more than 101.5 F ° °*Please note that pain medications will not be refilled after hours or on weekends. ° ° °Post Anesthesia Home Care Instructions ° °Activity: °Get plenty of rest for the remainder of the day. A responsible adult should stay with you for 24 hours following the procedure.  °For the next 24 hours, DO NOT: °-Drive a car °-Operate machinery °-Drink alcoholic beverages °-Take any medication unless instructed by your physician °-Make any legal decisions or sign important papers. ° °Meals: °Start with liquid foods such as gelatin or soup. Progress to regular foods as tolerated. Avoid greasy, spicy, heavy foods. If nausea and/or vomiting occur, drink only clear liquids until the nausea and/or vomiting subsides. Call your physician  if vomiting continues. ° °Special Instructions/Symptoms: °Your throat may feel dry or sore from the anesthesia or the breathing tube placed in your throat during surgery. If this causes discomfort, gargle with warm salt water. The discomfort should disappear within 24 hours. ° °If you had a scopolamine patch placed behind your ear for the management of post- operative nausea and/or vomiting: ° °1. The medication in the patch is effective for 72 hours, after which it should be removed.  Wrap patch in a tissue and discard in the trash. Wash hands thoroughly with soap and water. °2. You may remove the patch earlier than 72 hours if you experience unpleasant side effects which may include dry mouth, dizziness or visual disturbances. °3. Avoid touching the patch. Wash your hands with soap and water after contact with the patch. °  ° °

## 2015-09-22 ENCOUNTER — Encounter (HOSPITAL_BASED_OUTPATIENT_CLINIC_OR_DEPARTMENT_OTHER): Payer: Self-pay | Admitting: Orthopedic Surgery

## 2015-11-13 ENCOUNTER — Other Ambulatory Visit: Payer: Self-pay | Admitting: *Deleted

## 2015-11-13 MED ORDER — LOSARTAN POTASSIUM 25 MG PO TABS: 12.5000 mg | ORAL_TABLET | Freq: Every day | ORAL | 1 refills | Status: DC

## 2015-11-20 ENCOUNTER — Telehealth: Payer: Self-pay | Admitting: Family Medicine

## 2015-11-20 NOTE — Telephone Encounter (Signed)
Patient brought in a Renewal of Disability Parking Placard form to be filled out.  The form was put in the doctor's prescription incoming box.

## 2015-11-20 NOTE — Telephone Encounter (Signed)
Handicap Placard Renewal completed and placed in Dr. Daphine DeutscherBedsole's in box for signature.

## 2015-11-21 NOTE — Telephone Encounter (Signed)
Left message for Mr. Joshua Campbell that his handicap placard renewal form is ready to be picked up at the front desk.

## 2015-12-26 ENCOUNTER — Other Ambulatory Visit: Payer: Self-pay | Admitting: Family Medicine

## 2015-12-26 NOTE — Telephone Encounter (Signed)
Last office visit 04/07/2015.  Last refilled 04/07/2015 for #[phone] with 1 refill.  Ok to refill?

## 2016-05-01 ENCOUNTER — Other Ambulatory Visit: Payer: Self-pay | Admitting: Family Medicine

## 2016-05-01 NOTE — Telephone Encounter (Signed)
Last office visit 04/07/2015.  Cialis is not on current medication list. No future appointments scheduled.  Refill?

## 2016-05-01 NOTE — Telephone Encounter (Signed)
Pt left v/m requesting rx gabapentin 600 mg [facility] [address] Last printed # 270 on 12/26/15. Last seen 04/07/15. No future appt scheduled.Please advise.

## 2016-05-02 NOTE — Telephone Encounter (Signed)
Refused refills.. Let pt know he needs to make appt for CPX with labs prior. Once this is done you can refill to the appt date.

## 2016-05-07 DIAGNOSIS — H2513 Age-related nuclear cataract, bilateral: Secondary | ICD-10-CM | POA: Diagnosis not present

## 2016-05-07 DIAGNOSIS — E119 Type 2 diabetes mellitus without complications: Secondary | ICD-10-CM | POA: Diagnosis not present

## 2016-05-07 DIAGNOSIS — H40013 Open angle with borderline findings, low risk, bilateral: Secondary | ICD-10-CM | POA: Diagnosis not present

## 2016-05-07 LAB — HM DIABETES EYE EXAM

## 2016-05-10 ENCOUNTER — Encounter: Payer: Self-pay | Admitting: Family Medicine

## 2016-05-10 ENCOUNTER — Ambulatory Visit (INDEPENDENT_AMBULATORY_CARE_PROVIDER_SITE_OTHER): Payer: 59 | Admitting: Family Medicine

## 2016-05-10 VITALS — BP 138/80 | HR 84 | Temp 97.8°F | Ht 70.75 in | Wt 217.0 lb

## 2016-05-10 DIAGNOSIS — E1165 Type 2 diabetes mellitus with hyperglycemia: Secondary | ICD-10-CM

## 2016-05-10 DIAGNOSIS — M546 Pain in thoracic spine: Secondary | ICD-10-CM | POA: Diagnosis not present

## 2016-05-10 DIAGNOSIS — Z125 Encounter for screening for malignant neoplasm of prostate: Secondary | ICD-10-CM

## 2016-05-10 DIAGNOSIS — E782 Mixed hyperlipidemia: Secondary | ICD-10-CM | POA: Diagnosis not present

## 2016-05-10 DIAGNOSIS — R809 Proteinuria, unspecified: Secondary | ICD-10-CM

## 2016-05-10 DIAGNOSIS — G8929 Other chronic pain: Secondary | ICD-10-CM | POA: Diagnosis not present

## 2016-05-10 DIAGNOSIS — M545 Low back pain, unspecified: Secondary | ICD-10-CM

## 2016-05-10 DIAGNOSIS — E1142 Type 2 diabetes mellitus with diabetic polyneuropathy: Secondary | ICD-10-CM

## 2016-05-10 DIAGNOSIS — I10 Essential (primary) hypertension: Secondary | ICD-10-CM | POA: Diagnosis not present

## 2016-05-10 DIAGNOSIS — IMO0002 Reserved for concepts with insufficient information to code with codable children: Secondary | ICD-10-CM

## 2016-05-10 LAB — COMPREHENSIVE METABOLIC PANEL
ALK PHOS: 60 U/L (ref 39–117)
ALT: 15 U/L (ref 0–53)
AST: 15 U/L (ref 0–37)
Albumin: 4.2 g/dL (ref 3.5–5.2)
BUN: 14 mg/dL (ref 6–23)
CO2: 30 meq/L (ref 19–32)
Calcium: 9.6 mg/dL (ref 8.4–10.5)
Chloride: 96 mEq/L (ref 96–112)
Creatinine, Ser: 0.83 mg/dL (ref 0.40–1.50)
GFR: 101.4 mL/min (ref >60.00)
GLUCOSE: 281 mg/dL — AB (ref 70–99)
POTASSIUM: 4.2 meq/L (ref 3.5–5.1)
Sodium: 135 mEq/L (ref 135–145)
TOTAL PROTEIN: 7.2 g/dL (ref 6.0–8.3)
Total Bilirubin: 0.6 mg/dL (ref 0.2–1.2)

## 2016-05-10 LAB — LIPID PANEL
CHOL/HDL RATIO: 4
Cholesterol: 277 mg/dL — ABNORMAL HIGH (ref 0–200)
HDL: 62 mg/dL (ref >39.00)
LDL Cholesterol: 186 mg/dL — ABNORMAL HIGH (ref 0–99)
NONHDL: 214.66
Triglycerides: 141 mg/dL (ref 0.0–149.0)
VLDL: 28.2 mg/dL (ref 0.0–40.0)

## 2016-05-10 LAB — HEMOGLOBIN A1C: Hgb A1c MFr Bld: 12.1 % — ABNORMAL HIGH (ref 4.6–6.5)

## 2016-05-10 LAB — PSA: PSA: 0.56 ng/mL (ref 0.10–4.00)

## 2016-05-10 MED ORDER — GABAPENTIN 600 MG PO TABS: 600.0000 mg | ORAL_TABLET | Freq: Three times a day (TID) | ORAL | 1 refills | Status: DC

## 2016-05-10 MED ORDER — ACCU-CHEK AVIVA PLUS W/DEVICE KIT: PACK | 0 refills | Status: AC

## 2016-05-10 MED ORDER — LOSARTAN POTASSIUM 25 MG PO TABS: 12.5000 mg | ORAL_TABLET | Freq: Every day | ORAL | 3 refills | Status: DC

## 2016-05-10 MED ORDER — TADALAFIL 20 MG PO TABS: 20.0000 mg | ORAL_TABLET | ORAL | 1 refills | Status: DC | PRN

## 2016-05-10 MED ORDER — ACCU-CHEK SOFTCLIX LANCETS MISC: 3 refills | Status: DC

## 2016-05-10 MED ORDER — ACCU-CHEK AVIVA PLUS VI STRP: ORAL_STRIP | 3 refills | Status: DC

## 2016-05-10 NOTE — Assessment & Plan Note (Signed)
Manageable with gabapentin 600 mg TID, using aspirin or tylenol for breakthrough.  Pt agreeable to remaining off narcotic as this is high risk medication. No further refills unless seen for acute issue.

## 2016-05-10 NOTE — Assessment & Plan Note (Signed)
Due for re-eval. Likely poor control and needs to restart stain. Encouraged exercise, weight loss, healthy eating habits.

## 2016-05-10 NOTE — Assessment & Plan Note (Signed)
Well controlled. Continue current medication.  

## 2016-05-10 NOTE — Assessment & Plan Note (Signed)
Manageable with gabapentin 600 mg TID, using aspirin or tylenol for breakthrough.  Pt agreeable to remaining off narcotic as this is high risk medication. No further refills unless seen for acute issue. 

## 2016-05-10 NOTE — Patient Instructions (Addendum)
Please stop at the lab to set up to have labs drawn.  Work on low [employer].  Work on increasing activity.. Water exercise.  Call Dr. Kenna Gilbert office to schedule repeat colonoscopy.

## 2016-05-10 NOTE — Assessment & Plan Note (Signed)
Unclear control but likely poor off metformin. Will eval with labs. Encouraged exercise, weight loss, healthy eating habits.

## 2016-05-10 NOTE — Progress Notes (Signed)
Subjective:    Patient ID: Joshua Campbell, male    DOB: [birth year], 56 y.o.   MRN: [id number]  HPI   56 year old male presents for med refill. He has not been seen in over 1 year.  He is due for his CPX, he will return for this at a later date, but does not think he can for the next 6 months. [place] here. Has MD at beach in [place]. Dr. Fernand Parkins   Diabetes:  Due for re-eval. He has not been taking metformin in last 6-95months.  Using medications without difficulties: Hypoglycemic episodes:? Hyperglycemic episodes:? Feet problems: Blood Sugars averaging: meter broke eye exam within last year:  Hypertension:   Good control on losartan 25 mg daily   BP Readings from Last 3 Encounters:  05/10/16 138/80  09/21/15 (!) 146/74  04/07/15 135/78  Using medication without problems or lightheadedness:  none Chest pain with exertion: none Edema:none Short of breath: none Average home BPs: Other issues:  Elevated Cholesterol: Due for re-eval.  He has not been taking simvastatin 40 mg daily. Using medications without problems: Muscle aches:  Diet compliance:  poor Exercise: none, given chronic back pain Other complaints:    Has lost 15 lbs in last year. Wt Readings from Last 3 Encounters:  05/10/16 217 lb (98.4 kg)  09/21/15 224 lb (101.6 kg)  04/07/15 230 lb 4 oz (104.4 kg)    Needs cialis 90 days. Helps with ED.  Thoracic back pain: Pain controlled with gabapentin. For severe pain.. Breakthrough ends up using  oxycodone 5/325 once a week. He has left his med at [facility].. So last took in [place]. Has been using BC powder instead... Helps some.  he is willing to stop narcotic use given need foor q3 month follow up necessary that he would not be able to make. Also he is doing pretty well. Using cyclobenzaprine as needed.  Recent surgery for broken collarbone: 08/2015.   Review of Systems  Constitutional: Negative for  fatigue and fever.  HENT: Negative for ear pain.   Eyes: Negative for pain.  Respiratory: Negative for cough and shortness of breath.   Cardiovascular: Negative for chest pain, palpitations and leg swelling.  Gastrointestinal: Negative for abdominal pain.  Genitourinary: Negative for dysuria.  Musculoskeletal: Negative for arthralgias.  Neurological: Negative for syncope, light-headedness and headaches.  Psychiatric/Behavioral: Negative for dysphoric mood.       Objective:   Physical Exam  Constitutional: Vital signs are normal. He appears well-developed and well-nourished.  HENT:  Head: Normocephalic.  Right Ear: Hearing normal.  Left Ear: Hearing normal.  Nose: Nose normal.  Mouth/Throat: Oropharynx is clear and moist and mucous membranes are normal.  Neck: Trachea normal. Carotid bruit is not present. No thyroid mass and no thyromegaly present.  Cardiovascular: Normal rate, regular rhythm and normal pulses.  Exam reveals no gallop, no distant heart sounds and no friction rub.   No murmur heard. No peripheral edema  Pulmonary/Chest: Effort normal and breath sounds normal. No respiratory distress.  Skin: Skin is warm, dry and intact. No rash noted.  Psychiatric: He has a normal mood and affect. His speech is normal and behavior is normal. Thought content normal.     Diabetic foot exam: Normal inspection.. Except dry skin No skin breakdown No calluses  Normal DP pulses Decreased sensation to light touch and monofilament bilaterally Nails normal     Assessment & Plan:  Refused prevnar.

## 2016-05-10 NOTE — Assessment & Plan Note (Signed)
ON ACEI. 

## 2016-05-10 NOTE — Progress Notes (Signed)
Pre visit review using our clinic review tool, if applicable. No additional management support is needed unless otherwise documented below in the visit note. 

## 2016-05-13 ENCOUNTER — Telehealth: Payer: Self-pay | Admitting: Family Medicine

## 2016-05-13 MED ORDER — ATORVASTATIN CALCIUM 40 MG PO TABS: 40.0000 mg | ORAL_TABLET | Freq: Every day | ORAL | 3 refills | Status: DC

## 2016-05-13 NOTE — Telephone Encounter (Signed)
Patient returned Donna's call. °

## 2016-05-13 NOTE — Telephone Encounter (Signed)
Mr. Kouba notified of lab results.  See Result Note from labs drawn on 05/10/2016.

## 2017-02-05 ENCOUNTER — Other Ambulatory Visit: Payer: Self-pay | Admitting: *Deleted

## 2017-02-05 ENCOUNTER — Telehealth: Payer: Self-pay | Admitting: Family Medicine

## 2017-02-05 MED ORDER — METFORMIN HCL ER 500 MG PO TB24: ORAL_TABLET | ORAL | 0 refills | Status: DC

## 2017-02-05 NOTE — Telephone Encounter (Signed)
Is pt aware refill done?

## 2017-02-05 NOTE — Telephone Encounter (Signed)
Copied from CRM [phone]. Topic: Quick Communication - See Telephone Encounter >> Feb 05, 2017  3:20 PM Trula SladeWalter, Linda F wrote: CRM for notification. See Telephone encounter for:  02/05/17. Patient would like his metFORMIN (GLUCOPHAGE-XR) 500 MG 24 hr tablet Medication refilled.  [place] and is completely out of his medication.  [facility] [address], [address] Ph: [phone].

## 2017-02-05 NOTE — Telephone Encounter (Signed)
[facility] [place]

## 2017-02-06 NOTE — Telephone Encounter (Signed)
Yes I called and he picked it up yesterday.

## 2017-03-04 ENCOUNTER — Other Ambulatory Visit: Payer: Self-pay | Admitting: Family Medicine

## 2017-03-04 NOTE — Telephone Encounter (Signed)
[facility]: Pt states he is good on Metformin for right now, his CPE is scheduled for 04/29/17

## 2017-03-04 NOTE — Telephone Encounter (Signed)
Left message for Joshua Campbell to call me back and let me know if he is still [place] the beach.  Not sure if he requested refill or if [facility] as an [facility].  He is past due for his Annual Physical with Dr. Retia PasseBedsole  Ok to [facility] to get information from Joshua Campbell when he calls back as well as schedule him for his CPE with fasting labs prior with Dr. Ermalene SearingBedsole.

## 2017-03-31 ENCOUNTER — Telehealth: Payer: Self-pay | Admitting: Family Medicine

## 2017-03-31 ENCOUNTER — Other Ambulatory Visit: Payer: Self-pay | Admitting: *Deleted

## 2017-03-31 MED ORDER — METFORMIN HCL ER 500 MG PO TB24: ORAL_TABLET | ORAL | 0 refills | Status: DC

## 2017-03-31 NOTE — Telephone Encounter (Signed)
Copied from CRM [phone]. Topic: Quick Communication - See Telephone Encounter >> [name] 31, 2017  2:07 PM Terisa Starraylor, Brittany L wrote: CRM for notification. See Telephone encounter for:   03/31/17.   Patient wants to know if Dr Ermalene SearingBedsole can give him a month prescription on his metFORMIN (GLUCOPHAGE-XR) 500 MG 24 hr tablet until his appt. He only has 5 days left on it. He wants [facility] to [id number] [place], [place] - [address]

## 2017-04-01 NOTE — Telephone Encounter (Signed)
Medication filled in another encounter by provider. 

## 2017-04-22 DIAGNOSIS — E119 Type 2 diabetes mellitus without complications: Secondary | ICD-10-CM | POA: Diagnosis not present

## 2017-04-22 DIAGNOSIS — G629 Polyneuropathy, unspecified: Secondary | ICD-10-CM | POA: Diagnosis not present

## 2017-04-22 DIAGNOSIS — I10 Essential (primary) hypertension: Secondary | ICD-10-CM | POA: Diagnosis not present

## 2017-04-23 ENCOUNTER — Other Ambulatory Visit: Payer: 59

## 2017-04-23 ENCOUNTER — Telehealth: Payer: Self-pay | Admitting: Family Medicine

## 2017-04-23 DIAGNOSIS — Z125 Encounter for screening for malignant neoplasm of prostate: Secondary | ICD-10-CM

## 2017-04-23 DIAGNOSIS — E1165 Type 2 diabetes mellitus with hyperglycemia: Secondary | ICD-10-CM

## 2017-04-23 NOTE — Telephone Encounter (Signed)
-----   Message from Terri J Walsh sent at 04/16/2017 11:24 AM EDT ----- Regarding: Lab orders for Wednesday, 3.27.19 Patient is scheduled for CPX labs, please order future labs, Thanks , Terri  

## 2017-04-29 ENCOUNTER — Encounter: Payer: 59 | Admitting: Family Medicine

## 2017-07-10 DIAGNOSIS — J069 Acute upper respiratory infection, unspecified: Secondary | ICD-10-CM | POA: Diagnosis not present

## 2017-07-10 DIAGNOSIS — J329 Chronic sinusitis, unspecified: Secondary | ICD-10-CM | POA: Diagnosis not present

## 2017-09-09 DIAGNOSIS — H2513 Age-related nuclear cataract, bilateral: Secondary | ICD-10-CM | POA: Diagnosis not present

## 2017-09-09 DIAGNOSIS — E119 Type 2 diabetes mellitus without complications: Secondary | ICD-10-CM | POA: Diagnosis not present

## 2017-09-09 DIAGNOSIS — H40013 Open angle with borderline findings, low risk, bilateral: Secondary | ICD-10-CM | POA: Diagnosis not present

## 2017-09-09 LAB — HM DIABETES EYE EXAM

## 2017-09-12 ENCOUNTER — Encounter: Payer: Self-pay | Admitting: Family Medicine

## 2017-09-18 ENCOUNTER — Other Ambulatory Visit (INDEPENDENT_AMBULATORY_CARE_PROVIDER_SITE_OTHER): Payer: 59

## 2017-09-18 DIAGNOSIS — E1165 Type 2 diabetes mellitus with hyperglycemia: Secondary | ICD-10-CM | POA: Diagnosis not present

## 2017-09-18 LAB — COMPREHENSIVE METABOLIC PANEL
ALK PHOS: 51 U/L (ref 39–117)
ALT: 28 U/L (ref 0–53)
AST: 26 U/L (ref 0–37)
Albumin: 4.1 g/dL (ref 3.5–5.2)
BILIRUBIN TOTAL: 0.5 mg/dL (ref 0.2–1.2)
BUN: 17 mg/dL (ref 6–23)
CALCIUM: 10.3 mg/dL (ref 8.4–10.5)
CO2: 30 meq/L (ref 19–32)
Chloride: 95 mEq/L — ABNORMAL LOW (ref 96–112)
Creatinine, Ser: 0.91 mg/dL (ref 0.40–1.50)
GFR: 90.75 mL/min (ref >60.00)
GLUCOSE: 263 mg/dL — AB (ref 70–99)
POTASSIUM: 4.2 meq/L (ref 3.5–5.1)
Sodium: 135 mEq/L (ref 135–145)
TOTAL PROTEIN: 7.4 g/dL (ref 6.0–8.3)

## 2017-09-19 ENCOUNTER — Other Ambulatory Visit (INDEPENDENT_AMBULATORY_CARE_PROVIDER_SITE_OTHER): Payer: 59

## 2017-09-19 DIAGNOSIS — E1165 Type 2 diabetes mellitus with hyperglycemia: Secondary | ICD-10-CM | POA: Diagnosis not present

## 2017-09-19 LAB — LIPID PANEL
Cholesterol: 235 mg/dL — ABNORMAL HIGH (ref 0–200)
HDL: 54.9 mg/dL (ref >39.00)
NonHDL: 180.51
Total CHOL/HDL Ratio: 4
Triglycerides: 212 mg/dL — ABNORMAL HIGH (ref 0.0–149.0)
VLDL: 42.4 mg/dL — ABNORMAL HIGH (ref 0.0–40.0)

## 2017-09-19 LAB — LDL CHOLESTEROL, DIRECT: Direct LDL: 165 mg/dL

## 2017-09-22 ENCOUNTER — Encounter: Payer: Self-pay | Admitting: *Deleted

## 2017-09-23 ENCOUNTER — Ambulatory Visit: Payer: 59 | Admitting: Family Medicine

## 2017-09-23 ENCOUNTER — Encounter: Payer: Self-pay | Admitting: Family Medicine

## 2017-09-23 VITALS — BP 120/70 | HR 99 | Temp 97.6°F | Ht 70.5 in | Wt 223.5 lb

## 2017-09-23 DIAGNOSIS — E782 Mixed hyperlipidemia: Secondary | ICD-10-CM | POA: Diagnosis not present

## 2017-09-23 DIAGNOSIS — I10 Essential (primary) hypertension: Secondary | ICD-10-CM

## 2017-09-23 DIAGNOSIS — E1165 Type 2 diabetes mellitus with hyperglycemia: Secondary | ICD-10-CM

## 2017-09-23 LAB — POCT GLYCOSYLATED HEMOGLOBIN (HGB A1C): Hemoglobin A1C: 10.3 % — AB (ref 4.0–5.6)

## 2017-09-23 LAB — HM DIABETES FOOT EXAM

## 2017-09-23 MED ORDER — DULAGLUTIDE 0.75 MG/0.5ML ~~LOC~~ SOAJ: 0.7500 mg | SUBCUTANEOUS | 11 refills | Status: DC

## 2017-09-23 MED ORDER — LOSARTAN POTASSIUM 25 MG PO TABS: 25.0000 mg | ORAL_TABLET | Freq: Every day | ORAL | 1 refills | Status: DC

## 2017-09-23 MED ORDER — METFORMIN HCL ER 500 MG PO TB24: ORAL_TABLET | ORAL | 3 refills | Status: DC

## 2017-09-23 MED ORDER — ATORVASTATIN CALCIUM 40 MG PO TABS: 40.0000 mg | ORAL_TABLET | Freq: Every day | ORAL | 3 refills | Status: DC

## 2017-09-23 MED ORDER — TADALAFIL 20 MG PO TABS: 20.0000 mg | ORAL_TABLET | ORAL | 1 refills | Status: DC | PRN

## 2017-09-23 MED ORDER — GABAPENTIN 600 MG PO TABS: 600.0000 mg | ORAL_TABLET | Freq: Three times a day (TID) | ORAL | 1 refills | Status: DC

## 2017-09-23 NOTE — Patient Instructions (Addendum)
Restart atorvastatin 40 mg daily .Marland Kitchen. Can take in AM.  Can decrease metformin to 2 tabs daily.  Start Trulicity weekly.. Call in 3-4 weeks with fasting blood sugar measurements.  If you cannot follow up here for labs in 3 months.. Have labs done at beach and fax here.. A1C, lipids, CMET.

## 2017-09-23 NOTE — Progress Notes (Signed)
   Subjective:    Patient ID: Joshua Campbell, male    DOB: 07/26/1960, 57 y.o.   MRN: [id number]  HPI 57 year old male presents for DM follow up   He has noted new onset rash on legs and arms.. improved with cortisone 10.  Diabetes:   Poor control on  Metformin 4 tabs daily (was causing diarrhea so only taking 2 a day). [place]. Lab Results  Component Value Date   HGBA1C 10.3 (A) 09/23/2017  Using medications without difficulties: Hypoglycemic episodes: Hyperglycemic episodes: Feet problems:no ulcers Blood Sugars averaging:not checking eye exam within last year:  Hypertension:   Good control on losartan one tablet daily Using medication without problems or lightheadedness: none Chest pain with exertion:none Edema:none Short of breath:none Average home BPs: Other issues:  Elevated Cholesterol: No longer on lipitor.. Forgets to take. Lab Results  Component Value Date   CHOL 235 (H) 09/19/2017   HDL 54.90 09/19/2017   LDLCALC 186 (H) 05/10/2016   LDLDIRECT 165.0 09/19/2017   TRIG 212.0 (H) 09/19/2017   CHOLHDL 4 09/19/2017  Using medications without problems: Muscle aches:  Diet compliance: poor Exercise:minial Other complaints:   Social History /Family History/Past Medical History reviewed in detail and updated in EMR if needed. Blood pressure 120/70, pulse 99, temperature 97.6 F (36.4 C), temperature source Oral, height 5' 10.5" (1.791 m), weight 223 lb 8 oz (101.4 kg).   Review of Systems  Constitutional: Positive for fatigue. Negative for fever.  HENT: Negative for ear pain.   Eyes: Negative for pain.  Respiratory: Negative for cough and shortness of breath.   Cardiovascular: Negative for chest pain, palpitations and leg swelling.  Gastrointestinal: Negative for abdominal pain.  Genitourinary: Negative for dysuria.  Musculoskeletal: Negative for arthralgias.  Neurological: Negative for syncope, light-headedness and headaches.    Psychiatric/Behavioral: Negative for dysphoric mood.       Objective:   Physical Exam  Constitutional: Vital signs are normal. He appears well-developed and well-nourished.  obese  HENT:  Head: Normocephalic.  Right Ear: Hearing normal.  Left Ear: Hearing normal.  Nose: Nose normal.  Mouth/Throat: Oropharynx is clear and moist and mucous membranes are normal.  Neck: Trachea normal. Carotid bruit is not present. No thyroid mass and no thyromegaly present.  Cardiovascular: Normal rate, regular rhythm and normal pulses. Exam reveals no gallop, no distant heart sounds and no friction rub.  No murmur heard. No peripheral edema  Pulmonary/Chest: Effort normal and breath sounds normal. No respiratory distress.  Skin: Skin is warm, dry and intact. No rash noted.  Psychiatric: He has a normal mood and affect. His speech is normal and behavior is normal. Thought content normal.     Diabetic foot exam: Normal inspection No skin breakdown No calluses  Normal DP pulses Decreased sensation to light touch and monofilament Nails normal      Assessment & Plan:

## 2017-10-11 NOTE — Assessment & Plan Note (Signed)
Well controlled. Continue current medication.  

## 2017-10-11 NOTE — Assessment & Plan Note (Signed)
Poor control. Restart statin.

## 2017-10-11 NOTE — Assessment & Plan Note (Signed)
Poor control and SE to metformin. Plan to  decrease metformin to 2 tabs daily.  Start Trulicity weekly.. Call in 3-4 weeks with fasting blood sugar measurements.

## 2017-12-30 ENCOUNTER — Other Ambulatory Visit: Payer: Self-pay | Admitting: *Deleted

## 2017-12-30 MED ORDER — ACCU-CHEK SOFTCLIX LANCETS MISC: 3 refills | Status: DC

## 2017-12-30 MED ORDER — ACCU-CHEK AVIVA PLUS VI STRP: ORAL_STRIP | 3 refills | Status: DC

## 2018-02-17 ENCOUNTER — Ambulatory Visit: Payer: BLUE CROSS/BLUE SHIELD | Admitting: Family Medicine

## 2018-02-17 ENCOUNTER — Telehealth: Payer: Self-pay

## 2018-02-17 ENCOUNTER — Encounter: Payer: Self-pay | Admitting: Family Medicine

## 2018-02-17 VITALS — BP 144/72 | HR 82 | Temp 98.2°F | Resp 10 | Ht 70.5 in | Wt 242.2 lb

## 2018-02-17 DIAGNOSIS — R0981 Nasal congestion: Secondary | ICD-10-CM

## 2018-02-17 MED ORDER — FLUTICASONE PROPIONATE 50 MCG/ACT NA SUSP: 2.0000 | Freq: Every day | NASAL | 1 refills | Status: DC

## 2018-02-17 MED ORDER — AMOXICILLIN-POT CLAVULANATE 875-125 MG PO TABS: 1.0000 | ORAL_TABLET | Freq: Two times a day (BID) | ORAL | 0 refills | Status: DC

## 2018-02-17 MED ORDER — SALINE SPRAY 0.65 % NA SOLN: 1.0000 | NASAL | 0 refills | Status: DC | PRN

## 2018-02-17 NOTE — Telephone Encounter (Signed)
Patient was seen by Dr. Selena Batten today and he asked to be scheduled to see Dr. Ermalene Searing for his follow up. Last visit was in August 2019 and he did not get a chance to [place] yet. He is coming in for lab work on 02/20/2018 and appointment with Dr. Ermalene Searing to follow up on 02/24/2018. Patient wanted me to send a note and to make sure lab work orders are placed for Friday 02/20/2018. Thank you

## 2018-02-17 NOTE — Patient Instructions (Addendum)
Based on your symptoms, it looks like you have a virus.   Antibiotics are not need for a viral infection but the following will help:   1. Drink plenty of fluids 2. Get lots of rest  Sinus Congestion/Jaw pain/Ear fluid 1) Saline Spray-- 3-4 times a day 2) Flonase (Store Brand ok) - once daily 3) Over the counter congestion medications 4) Try an allergy medication like Claritin, Zyrtec   OK to start the antibiotic if symptoms persisting beyond 6-7 days total of illness.    If you develop fevers (Temperature >100.4), chills, worsening symptoms or symptoms lasting longer than 10 days return to clinic.

## 2018-02-17 NOTE — Progress Notes (Signed)
Subjective:     Joshua Campbell is a 58 y.o. male presenting for Jaw Pain (Symptoms started 3 days ago. Pain on the right side of the face, gum area painful and radiating to the right ear., runny nose, drainage a little bit. No fever. No headaches.)     URI   This is a new problem. The current episode started in the past 7 days. The problem has been gradually worsening. There has been no fever. Associated symptoms include congestion, ear pain, a plugged ear sensation, rhinorrhea and sinus pain. Pertinent negatives include no abdominal pain, chest pain, coughing, headaches, nausea, sore throat, swollen glands, vomiting or wheezing. He has tried NSAIDs (gabapentin) for the symptoms. The treatment provided mild relief.   Pain is worse when he feels congested at night   Review of Systems  HENT: Positive for congestion, ear pain, postnasal drip, rhinorrhea and sinus pain. Negative for facial swelling and sore throat.        Jaw pain - with dentures  Respiratory: Negative for cough and wheezing.   Cardiovascular: Negative for chest pain.  Gastrointestinal: Negative for abdominal pain, nausea and vomiting.  Neurological: Negative for headaches.     Social History   Tobacco Use  Smoking Status Former Smoker  . Years: 60.00  . Last attempt to quit: 01/29/2000  . Years since quitting: 18.0  Smokeless Tobacco Former User        Objective:    BP Readings from Last 3 Encounters:  02/17/18 (!) 144/72  09/23/17 120/70  05/10/16 138/80   Wt Readings from Last 3 Encounters:  02/17/18 242 lb 4 oz (109.9 kg)  09/23/17 223 lb 8 oz (101.4 kg)  05/10/16 217 lb (98.4 kg)    BP (!) 144/72   Pulse 82   Temp 98.2 F (36.8 C)   Resp 10   Ht 5' 10.5" (1.791 m)   Wt 242 lb 4 oz (109.9 kg)   SpO2 99%   BMI 34.27 kg/m    Physical Exam Constitutional:      General: He is not in acute distress.    Appearance: He is well-developed. He is not ill-appearing.  HENT:     Head:  Normocephalic and atraumatic.     Right Ear: Ear canal normal. A middle ear effusion is present. Tympanic membrane is not erythematous, retracted or bulging.     Left Ear: Ear canal normal. There is impacted cerumen.     Nose: Mucosal edema and rhinorrhea present.     Right Sinus: No maxillary sinus tenderness or frontal sinus tenderness.     Left Sinus: No maxillary sinus tenderness or frontal sinus tenderness.     Mouth/Throat:     Lips: No lesions.     Mouth: Mucous membranes are moist. No lacerations or oral lesions.     Dentition: Has dentures (upper).     Pharynx: Uvula midline. Posterior oropharyngeal erythema present. No oropharyngeal exudate.     Tonsils: Swelling: 0 on the right. 0 on the left.     Comments: No swelling, erythema, no tenderness to palpation along the lower jaw. Lower dentures were removed Neck:     Musculoskeletal: Neck supple.  Cardiovascular:     Rate and Rhythm: Normal rate and regular rhythm.     Heart sounds: No murmur.  Pulmonary:     Effort: Pulmonary effort is normal. No respiratory distress.     Breath sounds: Normal breath sounds.  Lymphadenopathy:     Cervical: No  cervical adenopathy.  Skin:    General: Skin is warm and dry.     Capillary Refill: Capillary refill takes less than 2 seconds.  Neurological:     Mental Status: He is alert.           Assessment & Plan:   Problem List Items Addressed This Visit    None    Visit Diagnoses    Sinus congestion    -  Primary   Relevant Medications   amoxicillin-clavulanate (AUGMENTIN) 875-125 MG tablet   fluticasone (FLONASE) 50 MCG/ACT nasal spray   sodium chloride (OCEAN) 0.65 % SOLN nasal spray     Given that jaw symptoms worse with congestion suspect it may be viral sinus irritation. Advised saline spray, flonase, and allergy medication trial. If symptoms not improving abx provide to start in 3-4 days.   Strict return precautions if worsening symptoms as jaw pain etiology not entirely  clear. Though overall reassuring exam.   BP mildly elevated - PCP annual visit in 7 days.   Return if symptoms worsen or fail to improve.  Lynnda Child, MD

## 2018-02-19 ENCOUNTER — Telehealth: Payer: Self-pay | Admitting: Family Medicine

## 2018-02-19 DIAGNOSIS — Z125 Encounter for screening for malignant neoplasm of prostate: Secondary | ICD-10-CM

## 2018-02-19 DIAGNOSIS — E1165 Type 2 diabetes mellitus with hyperglycemia: Secondary | ICD-10-CM

## 2018-02-19 NOTE — Telephone Encounter (Signed)
-----   Message from [name] sent at 02/18/2018 10:27 AM EST ----- Regarding: Lab orders for Fridat, 1.24.20 Lab orders for a f/u appt

## 2018-02-20 ENCOUNTER — Other Ambulatory Visit (INDEPENDENT_AMBULATORY_CARE_PROVIDER_SITE_OTHER): Payer: BLUE CROSS/BLUE SHIELD

## 2018-02-20 DIAGNOSIS — Z125 Encounter for screening for malignant neoplasm of prostate: Secondary | ICD-10-CM

## 2018-02-20 DIAGNOSIS — E1165 Type 2 diabetes mellitus with hyperglycemia: Secondary | ICD-10-CM

## 2018-02-20 LAB — COMPREHENSIVE METABOLIC PANEL
ALT: 32 U/L (ref 0–53)
AST: 32 U/L (ref 0–37)
Albumin: 4.3 g/dL (ref 3.5–5.2)
Alkaline Phosphatase: 61 U/L (ref 39–117)
BUN: 11 mg/dL (ref 6–23)
CHLORIDE: 96 meq/L (ref 96–112)
CO2: 33 mEq/L — ABNORMAL HIGH (ref 19–32)
Calcium: 10 mg/dL (ref 8.4–10.5)
Creatinine, Ser: 0.85 mg/dL (ref 0.40–1.50)
GFR: 92.24 mL/min (ref >60.00)
Glucose, Bld: 247 mg/dL — ABNORMAL HIGH (ref 70–99)
Potassium: 4.7 mEq/L (ref 3.5–5.1)
Sodium: 138 mEq/L (ref 135–145)
Total Bilirubin: 0.5 mg/dL (ref 0.2–1.2)
Total Protein: 7.2 g/dL (ref 6.0–8.3)

## 2018-02-20 LAB — LIPID PANEL
Cholesterol: 160 mg/dL (ref 0–200)
HDL: 59.8 mg/dL (ref >39.00)
LDL Cholesterol: 69 mg/dL (ref 0–99)
NonHDL: 100.07
Total CHOL/HDL Ratio: 3
Triglycerides: 154 mg/dL — ABNORMAL HIGH (ref 0.0–149.0)
VLDL: 30.8 mg/dL (ref 0.0–40.0)

## 2018-02-20 LAB — PSA: PSA: 1.09 ng/mL (ref 0.10–4.00)

## 2018-02-20 LAB — HEMOGLOBIN A1C: Hgb A1c MFr Bld: 9.1 % — ABNORMAL HIGH (ref 4.6–6.5)

## 2018-02-24 ENCOUNTER — Encounter: Payer: Self-pay | Admitting: Family Medicine

## 2018-02-24 ENCOUNTER — Ambulatory Visit: Payer: BLUE CROSS/BLUE SHIELD | Admitting: Family Medicine

## 2018-02-24 VITALS — BP 130/70 | HR 92 | Temp 98.3°F | Ht 70.5 in | Wt 239.5 lb

## 2018-02-24 DIAGNOSIS — E1165 Type 2 diabetes mellitus with hyperglycemia: Secondary | ICD-10-CM | POA: Diagnosis not present

## 2018-02-24 DIAGNOSIS — I10 Essential (primary) hypertension: Secondary | ICD-10-CM | POA: Diagnosis not present

## 2018-02-24 DIAGNOSIS — E782 Mixed hyperlipidemia: Secondary | ICD-10-CM | POA: Diagnosis not present

## 2018-02-24 DIAGNOSIS — F528 Other sexual dysfunction not due to a substance or known physiological condition: Secondary | ICD-10-CM

## 2018-02-24 LAB — HM DIABETES FOOT EXAM

## 2018-02-24 MED ORDER — TADALAFIL 20 MG PO TABS: 20.0000 mg | ORAL_TABLET | ORAL | 1 refills | Status: DC | PRN

## 2018-02-24 MED ORDER — DULAGLUTIDE 0.75 MG/0.5ML ~~LOC~~ SOAJ: 1.5000 mg | SUBCUTANEOUS | 11 refills | Status: DC

## 2018-02-24 NOTE — Assessment & Plan Note (Signed)
Well controlled. Continue current medication.  

## 2018-02-24 NOTE — Progress Notes (Signed)
Subjective:    Patient ID: Joshua Campbell, male    DOB: [birth year], 59 y.o.   MRN: [id number]  HPI  58 year old male presents for 3 month follow up DM.  Diabetes:  A1C not at goal but improved from 10.3.  On trulicity and metformin ( cannot tolerate higger dose given diarrhea) Lab Results  Component Value Date   HGBA1C 9.1 (H) 02/20/2018  Using medications without difficulties: none Hypoglycemic episodes: none Hyperglycemic episodes: yes Feet problems: none, neuropathy stable on gabapentin. Blood Sugars averaging: FBS 195, 256, 261, not checking postprandial eye exam within last year: 08/2017 Wt Readings from Last 3 Encounters:  02/24/18 239 lb 8 oz (108.6 kg)  02/17/18 242 lb 4 oz (109.9 kg)  09/23/17 223 lb 8 oz (101.4 kg)   Drinks beer daily 6 pack.   Elevated Cholesterol:  LDL at goal < 100 on  lipitor 40 mg daily Lab Results  Component Value Date   CHOL 160 02/20/2018   HDL 59.80 02/20/2018   LDLCALC 69 02/20/2018   LDLDIRECT 165.0 09/19/2017   TRIG 154.0 (H) 02/20/2018   CHOLHDL 3 02/20/2018  Using medications without problems:none Muscle aches: none Diet compliance: poor Exercise:none Other complaints:  Hypertension:   BP at goal on losartan  Using medication without problems or lightheadedness:  Chest pain with exertion: Edema: Short of breath: Average home BPs: Other issues:   Social History /Family History/Past Medical History reviewed in detail and updated in EMR if needed. Blood pressure 130/70, pulse 92, temperature 98.3 F (36.8 C), temperature source Oral, height 5' 10.5" (1.791 m), weight 239 lb 8 oz (108.6 kg), SpO2 92 %.  Review of Systems  Constitutional: Negative for fatigue and fever.  HENT: Negative for ear pain.   Eyes: Negative for pain.  Respiratory: Negative for cough and shortness of breath.   Cardiovascular: Negative for chest pain, palpitations and leg swelling.  Gastrointestinal: Negative for abdominal pain.  Genitourinary:  Negative for dysuria.  Musculoskeletal: Negative for arthralgias.  Neurological: Negative for syncope, light-headedness and headaches.  Psychiatric/Behavioral: Negative for dysphoric mood.       Objective:   Physical Exam Constitutional:      Appearance: He is well-developed. He is obese.  HENT:     Head: Normocephalic.     Right Ear: Hearing normal.     Left Ear: Hearing normal.     Nose: Nose normal.  Neck:     Thyroid: No thyroid mass or thyromegaly.     Vascular: No carotid bruit.     Trachea: Trachea normal.  Cardiovascular:     Rate and Rhythm: Normal rate and regular rhythm.     Pulses: Normal pulses.     Heart sounds: Heart sounds not distant. No murmur. No friction rub. No gallop.      Comments: No peripheral edema Pulmonary:     Effort: Pulmonary effort is normal. No respiratory distress.     Breath sounds: Normal breath sounds.  Skin:    General: Skin is warm and dry.     Findings: No rash.  Psychiatric:        Speech: Speech normal.        Behavior: Behavior normal.        Thought Content: Thought content normal.      Diabetic foot exam: Normal inspection No skin breakdown.. dry skin No calluses  Normal DP pulses decreased sensation to light touch and monofilament Nails thickened      Assessment & Plan:

## 2018-02-24 NOTE — Assessment & Plan Note (Signed)
Refilled med.. working well.

## 2018-02-24 NOTE — Patient Instructions (Addendum)
Cut back on beer to no more than 2 a day. Work on low carb diet, increase exercise as tolerated. Increase the Trulicity to 1.5 daily.  [place] for A1C in 3 months.

## 2018-02-24 NOTE — Assessment & Plan Note (Signed)
Improved control but not at goal... increase trulicity to  Max , continue metformin at current dose. If not at goal next OV .Marland Kitchen. consider insulin.  Encouraged pt to decrease ETOH as high intake of sugar.  Low carb diet.  Refused counseling.

## 2018-03-12 ENCOUNTER — Other Ambulatory Visit: Payer: Self-pay | Admitting: Family Medicine

## 2018-03-12 DIAGNOSIS — R0981 Nasal congestion: Secondary | ICD-10-CM

## 2018-04-06 ENCOUNTER — Telehealth: Payer: Self-pay

## 2018-04-06 NOTE — Telephone Encounter (Signed)
Pt called and said this is the second time pt has been given the trulicity 0.75 mg/0.64ml. pt said Dr Ermalene Searing increased his trulicity to 1.5 mg at the 02/24/18 office visit. Per 02/24/18 office note it has increase trulicity to 1.5 mg daily. Med list does have instructions to inject 1.5 mg into the skin once a wk. But pt continues to get trulicity 0.75 mg/0.5 ml. I spoke with Trinna Post at [facility] [address] and asked if trulicity could be dialed up on pen for pt to take 1 ml of 0.75 mg and pt would be receiving 1.5 mg weekly. Alex said that trulicity is a single dose pen so pt would have to take 2 separate injections of the 0.75 mg to equal 1.5 mg. Pt said he has been taking one shot per week; pt has been getting 0.75 mg weekly. Pt request cb after Dr Ermalene Searing reviews.

## 2018-04-07 MED ORDER — DULAGLUTIDE 1.5 MG/0.5ML ~~LOC~~ SOAJ: 1.5000 mg | SUBCUTANEOUS | 11 refills | Status: DC

## 2018-04-07 NOTE — Telephone Encounter (Signed)
I don't understand why he is getting 0.75 if we increased it on a prescription and sent in 1.5 mcg. Pharmacy needs to give him the correct dose .. right? If we simply need to send in rx again for  trulicity 1.5 mcg weekly again.Marland Kitchen okay to do that.

## 2018-04-07 NOTE — Telephone Encounter (Addendum)
It is a single dose pen.  So if he was to increase to 1.5 mcg weekly then he would need a new Rx for Trulicity 1.5 mcg pen.  Otherwise he would need 2 of the .75 pens and give himself 2 injections to get dose on 1.5 mcg.  New Rx sent in to 1.5 mcg pen to [facility] [address]  (Not sure [facility] not [facility] us for clarification when prescription was sent in for .75 mcg to take 1.5 mcg.)    Joshua Campbell notified by telephone.

## 2018-04-28 ENCOUNTER — Other Ambulatory Visit: Payer: Self-pay | Admitting: Family Medicine

## 2018-07-06 DIAGNOSIS — M5430 Sciatica, unspecified side: Secondary | ICD-10-CM | POA: Diagnosis not present

## 2018-07-06 DIAGNOSIS — M5127 Other intervertebral disc displacement, lumbosacral region: Secondary | ICD-10-CM | POA: Diagnosis not present

## 2018-07-06 DIAGNOSIS — S338XXA Sprain of other parts of lumbar spine and pelvis, initial encounter: Secondary | ICD-10-CM | POA: Diagnosis not present

## 2018-07-08 DIAGNOSIS — M5127 Other intervertebral disc displacement, lumbosacral region: Secondary | ICD-10-CM | POA: Diagnosis not present

## 2018-07-08 DIAGNOSIS — M5430 Sciatica, unspecified side: Secondary | ICD-10-CM | POA: Diagnosis not present

## 2018-07-08 DIAGNOSIS — S338XXA Sprain of other parts of lumbar spine and pelvis, initial encounter: Secondary | ICD-10-CM | POA: Diagnosis not present

## 2018-07-17 DIAGNOSIS — M5127 Other intervertebral disc displacement, lumbosacral region: Secondary | ICD-10-CM | POA: Diagnosis not present

## 2018-07-17 DIAGNOSIS — S338XXA Sprain of other parts of lumbar spine and pelvis, initial encounter: Secondary | ICD-10-CM | POA: Diagnosis not present

## 2018-07-17 DIAGNOSIS — M5430 Sciatica, unspecified side: Secondary | ICD-10-CM | POA: Diagnosis not present

## 2018-07-22 DIAGNOSIS — S338XXA Sprain of other parts of lumbar spine and pelvis, initial encounter: Secondary | ICD-10-CM | POA: Diagnosis not present

## 2018-07-22 DIAGNOSIS — M5127 Other intervertebral disc displacement, lumbosacral region: Secondary | ICD-10-CM | POA: Diagnosis not present

## 2018-07-22 DIAGNOSIS — M5430 Sciatica, unspecified side: Secondary | ICD-10-CM | POA: Diagnosis not present

## 2018-07-24 DIAGNOSIS — M5127 Other intervertebral disc displacement, lumbosacral region: Secondary | ICD-10-CM | POA: Diagnosis not present

## 2018-07-24 DIAGNOSIS — M5430 Sciatica, unspecified side: Secondary | ICD-10-CM | POA: Diagnosis not present

## 2018-07-24 DIAGNOSIS — S338XXA Sprain of other parts of lumbar spine and pelvis, initial encounter: Secondary | ICD-10-CM | POA: Diagnosis not present

## 2018-07-29 DIAGNOSIS — M5430 Sciatica, unspecified side: Secondary | ICD-10-CM | POA: Diagnosis not present

## 2018-07-29 DIAGNOSIS — M5127 Other intervertebral disc displacement, lumbosacral region: Secondary | ICD-10-CM | POA: Diagnosis not present

## 2018-07-29 DIAGNOSIS — S338XXA Sprain of other parts of lumbar spine and pelvis, initial encounter: Secondary | ICD-10-CM | POA: Diagnosis not present

## 2018-08-04 DIAGNOSIS — H6123 Impacted cerumen, bilateral: Secondary | ICD-10-CM | POA: Diagnosis not present

## 2018-08-04 DIAGNOSIS — M545 Low back pain: Secondary | ICD-10-CM | POA: Diagnosis not present

## 2018-08-04 DIAGNOSIS — M5431 Sciatica, right side: Secondary | ICD-10-CM | POA: Diagnosis not present

## 2018-08-18 DIAGNOSIS — M549 Dorsalgia, unspecified: Secondary | ICD-10-CM | POA: Diagnosis not present

## 2018-08-18 DIAGNOSIS — I10 Essential (primary) hypertension: Secondary | ICD-10-CM | POA: Diagnosis not present

## 2018-08-21 DIAGNOSIS — M160 Bilateral primary osteoarthritis of hip: Secondary | ICD-10-CM | POA: Diagnosis not present

## 2018-08-21 DIAGNOSIS — M1610 Unilateral primary osteoarthritis, unspecified hip: Secondary | ICD-10-CM | POA: Diagnosis not present

## 2018-08-21 DIAGNOSIS — M47816 Spondylosis without myelopathy or radiculopathy, lumbar region: Secondary | ICD-10-CM | POA: Diagnosis not present

## 2018-09-14 DIAGNOSIS — M4727 Other spondylosis with radiculopathy, lumbosacral region: Secondary | ICD-10-CM | POA: Diagnosis not present

## 2018-09-29 ENCOUNTER — Telehealth: Payer: Self-pay

## 2018-09-29 MED ORDER — ATORVASTATIN CALCIUM 40 MG PO TABS: 40.0000 mg | ORAL_TABLET | Freq: Every day | ORAL | 0 refills | Status: DC

## 2018-09-29 NOTE — Telephone Encounter (Signed)
Left message for Joshua Campbell to return call.  I need to verify which pharmacy he would like this refill sent to.  He also needs to schedule his CPE with Dr. Diona Browner.

## 2018-09-29 NOTE — Telephone Encounter (Signed)
[place] Night - Client Nonclinical Telephone Record AccessNurse Client [place] Night - Client Client [place] Physician Eliezer Lofts - MD Contact Type Call Who Is Calling Patient / Member / Family / Caregiver Caller Name Joshoa Shawler Caller Phone Number [phone] Patient Name Joshua Campbell Patient DOB 20-Aug-1960 Call Type Message Only Information Provided Reason for Call Request for General Office Information Initial Comment Caller states he needs a refill of Atorvastatin. Caller states he has enough to last until Tuesday morning. Additional Comment Office hours provided. Call Closed By: Christain [place] Transaction Date/Time: 09/28/2018 8:49:27 PM (ET)

## 2018-09-29 NOTE — Telephone Encounter (Signed)
Spoke with Joshua Campbell.  [place].  [facility]ill [facility] to [facility] [address] [address].  I always let him know that when he is back in town, he needs to schedule a CPE with Dr. Diona Browner. Patient states understanding.

## 2018-11-26 ENCOUNTER — Other Ambulatory Visit: Payer: Self-pay | Admitting: Family Medicine

## 2019-02-21 ENCOUNTER — Other Ambulatory Visit: Payer: Self-pay | Admitting: Family Medicine

## 2019-02-22 NOTE — Telephone Encounter (Signed)
Last officer visit 02/24/2018 for DM.  Last refilled 11/26/2018 for #180 with no refills.  No future appointments.  Refill?

## 2019-03-08 ENCOUNTER — Telehealth: Payer: Self-pay | Admitting: Family Medicine

## 2019-03-08 NOTE — Telephone Encounter (Signed)
Can you attempt to schedule CPE with fasting labs prior with Dr. Ermalene Searing.

## 2019-03-09 NOTE — Telephone Encounter (Signed)
Patient returned call Tried to schedule CPE/LABS but patient stated he wouldn't be back in town anytime soon due to things going on at home.

## 2019-03-09 NOTE — Telephone Encounter (Signed)
Left message asking pt to call office  °

## 2019-04-07 ENCOUNTER — Other Ambulatory Visit: Payer: Self-pay | Admitting: Family Medicine

## 2019-04-07 NOTE — Telephone Encounter (Signed)
Last office visit 02/24/2018 for DM.  Patient was called on 03/08/2019 to try and schedule CPE but patient stated he wouldn't be back in town anytime soon due to things going on at home.  Refill?

## 2019-05-24 ENCOUNTER — Other Ambulatory Visit: Payer: Self-pay | Admitting: Family Medicine

## 2019-05-24 NOTE — Telephone Encounter (Signed)
Last office visit 02/24/2018 for DM.  We called patient on 03/09/2019 to try and schedule CPE with fasting labs and patient stated he wouldn't be back in town anytime soon due to things going on at home. No future appointments.  Last refilled 02/22/2019 for #180 with no refills.

## 2019-06-07 ENCOUNTER — Other Ambulatory Visit: Payer: Self-pay | Admitting: Family Medicine

## 2019-06-22 DIAGNOSIS — I10 Essential (primary) hypertension: Secondary | ICD-10-CM | POA: Diagnosis not present

## 2019-06-22 DIAGNOSIS — N529 Male erectile dysfunction, unspecified: Secondary | ICD-10-CM | POA: Diagnosis not present

## 2019-06-22 DIAGNOSIS — E109 Type 1 diabetes mellitus without complications: Secondary | ICD-10-CM | POA: Diagnosis not present

## 2019-06-22 DIAGNOSIS — E785 Hyperlipidemia, unspecified: Secondary | ICD-10-CM | POA: Diagnosis not present

## 2019-06-22 DIAGNOSIS — E119 Type 2 diabetes mellitus without complications: Secondary | ICD-10-CM | POA: Diagnosis not present

## 2019-06-22 DIAGNOSIS — Z1329 Encounter for screening for other suspected endocrine disorder: Secondary | ICD-10-CM | POA: Diagnosis not present

## 2019-12-20 DIAGNOSIS — E119 Type 2 diabetes mellitus without complications: Secondary | ICD-10-CM | POA: Diagnosis not present

## 2019-12-20 DIAGNOSIS — E785 Hyperlipidemia, unspecified: Secondary | ICD-10-CM | POA: Diagnosis not present

## 2019-12-20 DIAGNOSIS — N529 Male erectile dysfunction, unspecified: Secondary | ICD-10-CM | POA: Diagnosis not present

## 2019-12-20 DIAGNOSIS — Z7984 Long term (current) use of oral hypoglycemic drugs: Secondary | ICD-10-CM | POA: Diagnosis not present

## 2019-12-20 DIAGNOSIS — I10 Essential (primary) hypertension: Secondary | ICD-10-CM | POA: Diagnosis not present

## 2020-01-26 ENCOUNTER — Encounter: Payer: Self-pay | Admitting: Family Medicine

## 2020-01-26 ENCOUNTER — Other Ambulatory Visit: Payer: Self-pay | Admitting: Family Medicine

## 2020-01-26 ENCOUNTER — Ambulatory Visit (INDEPENDENT_AMBULATORY_CARE_PROVIDER_SITE_OTHER)
Admission: RE | Admit: 2020-01-26 | Discharge: 2020-01-26 | Disposition: A | Discharge status: Home / Self Care | Payer: Self-pay | Source: Ambulatory Visit | Attending: Family Medicine | Admitting: Family Medicine

## 2020-01-26 ENCOUNTER — Ambulatory Visit (INDEPENDENT_AMBULATORY_CARE_PROVIDER_SITE_OTHER): Payer: Self-pay | Admitting: Family Medicine

## 2020-01-26 ENCOUNTER — Other Ambulatory Visit: Payer: Self-pay

## 2020-01-26 VITALS — BP 144/70 | HR 90 | Temp 98.4°F | Ht 70.0 in | Wt 248.0 lb

## 2020-01-26 DIAGNOSIS — M25562 Pain in left knee: Secondary | ICD-10-CM

## 2020-01-26 DIAGNOSIS — S82092A Other fracture of left patella, initial encounter for closed fracture: Secondary | ICD-10-CM | POA: Diagnosis not present

## 2020-01-26 DIAGNOSIS — W19XXXA Unspecified fall, initial encounter: Secondary | ICD-10-CM

## 2020-01-26 DIAGNOSIS — E1165 Type 2 diabetes mellitus with hyperglycemia: Secondary | ICD-10-CM

## 2020-01-26 MED ORDER — TRAMADOL HCL 50 MG PO TABS: 50.0000 mg | ORAL_TABLET | Freq: Three times a day (TID) | ORAL | 0 refills | Status: AC | PRN

## 2020-01-26 MED ORDER — ACCU-CHEK AVIVA PLUS VI STRP: ORAL_STRIP | 3 refills | Status: AC

## 2020-01-26 MED ORDER — ACCU-CHEK SOFTCLIX LANCETS MISC: 3 refills | Status: AC

## 2020-01-26 NOTE — Progress Notes (Signed)
Betti Goodenow T. Ninnie Fein, MD, [place] at [facility] [place], [address]  Phone: [phone]  FAX: [phone]  Joshua Campbell - 59 y.o. male  MRN [id number]  Date of Birth: Jun 28, 1960  Date: 01/26/2020  PCP: Jinny Sanders, MD  Referral: Jinny Sanders, MD  Chief Complaint  Patient presents with  . Knee Pain    Left  . Fall    Saturday-Landed on Left Knee    This visit occurred during the SARS-CoV-2 public health emergency.  Safety protocols were in place, including screening questions prior to the visit, additional usage of staff PPE, and extensive cleaning of exam room while observing appropriate contact time as indicated for disinfecting solutions.   Subjective:   Joshua Campbell is a 59 y.o. very pleasant male patient with Body mass index is 35.58 kg/m. who presents with the following:  Patient presents with a left-sided knee pain after a traumatic fall on Saturday.  Date of injury 01/22/2020.  No prior knee injuries.  Yesterday was maybe better, but had a really bad pain.  Had a prior surgery that had some Tramadol.  Did not sleep much.  Right now he is in some quite significant knee pain anteriorly.  He does have some swelling without any significant bruising.  He normally is able to walk and do whatever he wants, but today he is walking with a severe limp using a walker.  Review of Systems is noted in the HPI, as appropriate   Objective:   BP (!) 144/70   Pulse 90   Temp 98.4 F (36.9 C) (Temporal)   Ht '5\' 10"'  (1.778 m)   Wt 248 lb (112.5 kg)   SpO2 90%   BMI 35.58 kg/m   He has trouble rising from a seated position and even getting on the examination table.  He does not have any tenderness along the tibia or fibula.  Distal thigh is nontender.  He does have an acute point tender pain at the patella itself.  Does have some modest pain in the patellar tendon and  surrounding tissues.  There is a small effusion.  His knee is stable to varus and valgus stress.  I stop the exam secondary to pain to rule out potential fracture.  Radiology: No results found.  Assessment and Plan:     ICD-10-CM   1. Patellar sleeve fracture of left knee, closed, initial encounter  S82.092A   2. Uncontrolled type 2 diabetes mellitus with hyperglycemia (HCC)  E11.65 Accu-Chek Softclix Lancets lancets    ACCU-CHEK AVIVA PLUS test strip  3. Acute pain of left knee  M25.562 CANCELED: DG Knee 4 Views W/Patella Left  4. Fall, initial encounter  W19.Merril Abbe CANCELED: DG Knee 4 Views W/Patella Left   Closed nondisplaced fracture of the patella, somewhat lateral.  I placed the patient in a knee immobilizer, and he is to continue using his walker.  For now, minimal weightbearing, and I do want him to take the immobilizer off several times a day to work on some very basic motion.  He already has an appointment with Dr. Berenice Primas next week on Tuesday, and I think that that is appropriate for follow-up.  Meds ordered this encounter  Medications  . Accu-Chek Softclix Lancets lancets    Sig: Use to check blood sugars daily.  Dx: 11.65    Dispense:  100 each    Refill:  3  .  ACCU-CHEK AVIVA PLUS test strip    Sig: Use to check blood sugars daily.  Dx: 11.65    Dispense:  100 each    Refill:  3  . traMADol (ULTRAM) 50 MG tablet    Sig: Take 1 tablet (50 mg total) by mouth every 8 (eight) hours as needed for up to 5 days for moderate pain.    Dispense:  20 tablet    Refill:  0   Medications Discontinued During This Encounter  Medication Reason  . ACCU-CHEK AVIVA PLUS test strip Reorder  . ACCU-CHEK SOFTCLIX LANCETS lancets Reorder   No orders of the defined types were placed in this encounter.   Follow-up: No follow-ups on file.  Signed,  Maud Deed. Amarya Kuehl, MD   Outpatient Encounter Medications as of 01/26/2020  Medication Sig  . atorvastatin (LIPITOR) 40 MG tablet  TAKE 1 TABLET BY MOUTH EVERY DAY  . Blood Glucose Monitoring Suppl (ACCU-CHEK AVIVA PLUS) w/Device KIT Use to check blood sugars daily.  Dx: 11.65  . cetirizine (ZYRTEC) 10 MG tablet Take 10 mg by mouth daily.  . fluticasone (FLONASE) 50 MCG/ACT nasal spray SPRAY 2 SPRAYS INTO EACH NOSTRIL EVERY DAY  . gabapentin (NEURONTIN) 600 MG tablet Take 1 tablet (600 mg total) by mouth 3 (three) times daily.  Marland Kitchen losartan (COZAAR) 25 MG tablet TAKE 1 TABLET BY MOUTH EVERY DAY  . metFORMIN (GLUCOPHAGE-XR) 500 MG 24 hr tablet TAKE 2 TABLETS EVERY DAY WITH BREAKFAST  . omeprazole (PRILOSEC) 20 MG capsule Take 20 mg by mouth daily.  . sodium chloride (OCEAN) 0.65 % SOLN nasal spray Place 1 spray into both nostrils as needed for congestion.  . tadalafil (ADCIRCA/CIALIS) 20 MG tablet Take 1 tablet (20 mg total) by mouth every 3 (three) days as needed for erectile dysfunction.  . traMADol (ULTRAM) 50 MG tablet Take 1 tablet (50 mg total) by mouth every 8 (eight) hours as needed for up to 5 days for moderate pain.  . TRULICITY 1.5 FE/7.6DY SOPN INJECT 1.5 MG INTO THE SKIN ONCE A WEEK.  . [DISCONTINUED] ACCU-CHEK AVIVA PLUS test strip Use to check blood sugars daily.  Dx: 11.65  . [DISCONTINUED] ACCU-CHEK SOFTCLIX LANCETS lancets Use to check blood sugars daily.  Dx: 11.65  . ACCU-CHEK AVIVA PLUS test strip Use to check blood sugars daily.  Dx: 11.65  . Accu-Chek Softclix Lancets lancets Use to check blood sugars daily.  Dx: 11.65   No facility-administered encounter medications on file as of 01/26/2020.

## 2020-01-27 ENCOUNTER — Telehealth: Payer: Self-pay

## 2020-01-27 NOTE — Telephone Encounter (Signed)
Already known

## 2020-01-27 NOTE — Telephone Encounter (Signed)
FINDINGS: Acute nondisplaced fracture involving the lateral facet of the patella with articular surface extension. No dislocation. Moderate knee effusion.  IMPRESSION: Acute nondisplaced fracture involving the lateral facet of the patella with articular surface extension.

## 2020-02-01 DIAGNOSIS — M25562 Pain in left knee: Secondary | ICD-10-CM | POA: Diagnosis not present

## 2020-02-26 ENCOUNTER — Telehealth: Payer: Self-pay | Admitting: Family Medicine

## 2020-02-26 DIAGNOSIS — E1165 Type 2 diabetes mellitus with hyperglycemia: Secondary | ICD-10-CM

## 2020-02-26 DIAGNOSIS — Z125 Encounter for screening for malignant neoplasm of prostate: Secondary | ICD-10-CM

## 2020-02-26 NOTE — Telephone Encounter (Signed)
-----   Message from [name] sent at 02/15/2020  2:50 PM EST ----- Regarding: Lab orders for Tuesday, 2.1.22 Patient is scheduled for CPX labs, please order future labs, Thanks , Camelia Eng

## 2020-02-29 ENCOUNTER — Other Ambulatory Visit: Payer: Self-pay

## 2020-02-29 ENCOUNTER — Other Ambulatory Visit (INDEPENDENT_AMBULATORY_CARE_PROVIDER_SITE_OTHER): Payer: BC Managed Care – PPO

## 2020-02-29 DIAGNOSIS — Z125 Encounter for screening for malignant neoplasm of prostate: Secondary | ICD-10-CM

## 2020-02-29 DIAGNOSIS — E1165 Type 2 diabetes mellitus with hyperglycemia: Secondary | ICD-10-CM

## 2020-02-29 LAB — COMPREHENSIVE METABOLIC PANEL
ALT: 17 U/L (ref 0–53)
AST: 17 U/L (ref 0–37)
Albumin: 4.3 g/dL (ref 3.5–5.2)
Alkaline Phosphatase: 59 U/L (ref 39–117)
BUN: 12 mg/dL (ref 6–23)
CO2: 33 mEq/L — ABNORMAL HIGH (ref 19–32)
Calcium: 9.8 mg/dL (ref 8.4–10.5)
Chloride: 92 mEq/L — ABNORMAL LOW (ref 96–112)
Creatinine, Ser: 0.79 mg/dL (ref 0.40–1.50)
GFR: 96.18 mL/min (ref >60.00)
Glucose, Bld: 244 mg/dL — ABNORMAL HIGH (ref 70–99)
Potassium: 4.3 mEq/L (ref 3.5–5.1)
Sodium: 132 mEq/L — ABNORMAL LOW (ref 135–145)
Total Bilirubin: 1 mg/dL (ref 0.2–1.2)
Total Protein: 7.3 g/dL (ref 6.0–8.3)

## 2020-02-29 LAB — PSA: PSA: 0.39 ng/mL (ref 0.10–4.00)

## 2020-02-29 LAB — LIPID PANEL
Cholesterol: 156 mg/dL (ref 0–200)
HDL: 48.7 mg/dL (ref >39.00)
LDL Cholesterol: 72 mg/dL (ref 0–99)
NonHDL: 106.92
Total CHOL/HDL Ratio: 3
Triglycerides: 173 mg/dL — ABNORMAL HIGH (ref 0.0–149.0)
VLDL: 34.6 mg/dL (ref 0.0–40.0)

## 2020-02-29 LAB — HEMOGLOBIN A1C: Hgb A1c MFr Bld: 9.8 % — ABNORMAL HIGH (ref 4.6–6.5)

## 2020-03-07 ENCOUNTER — Other Ambulatory Visit: Payer: Self-pay

## 2020-03-07 ENCOUNTER — Encounter: Payer: Self-pay | Admitting: Family Medicine

## 2020-03-07 ENCOUNTER — Ambulatory Visit (INDEPENDENT_AMBULATORY_CARE_PROVIDER_SITE_OTHER): Payer: BC Managed Care – PPO | Admitting: Family Medicine

## 2020-03-07 VITALS — BP 120/62 | HR 86 | Temp 98.2°F | Ht 71.0 in | Wt 243.0 lb

## 2020-03-07 DIAGNOSIS — E1159 Type 2 diabetes mellitus with other circulatory complications: Secondary | ICD-10-CM

## 2020-03-07 DIAGNOSIS — G8929 Other chronic pain: Secondary | ICD-10-CM

## 2020-03-07 DIAGNOSIS — M546 Pain in thoracic spine: Secondary | ICD-10-CM

## 2020-03-07 DIAGNOSIS — I152 Hypertension secondary to endocrine disorders: Secondary | ICD-10-CM

## 2020-03-07 DIAGNOSIS — E1169 Type 2 diabetes mellitus with other specified complication: Secondary | ICD-10-CM | POA: Diagnosis not present

## 2020-03-07 DIAGNOSIS — IMO0002 Reserved for concepts with insufficient information to code with codable children: Secondary | ICD-10-CM

## 2020-03-07 DIAGNOSIS — E1165 Type 2 diabetes mellitus with hyperglycemia: Secondary | ICD-10-CM

## 2020-03-07 DIAGNOSIS — E785 Hyperlipidemia, unspecified: Secondary | ICD-10-CM

## 2020-03-07 DIAGNOSIS — R0902 Hypoxemia: Secondary | ICD-10-CM

## 2020-03-07 DIAGNOSIS — Z0000 Encounter for general adult medical examination without abnormal findings: Secondary | ICD-10-CM | POA: Diagnosis not present

## 2020-03-07 DIAGNOSIS — J209 Acute bronchitis, unspecified: Secondary | ICD-10-CM

## 2020-03-07 LAB — HM DIABETES FOOT EXAM

## 2020-03-07 MED ORDER — PREDNISONE 20 MG PO TABS: 20.0000 mg | ORAL_TABLET | Freq: Every day | ORAL | 0 refills | Status: DC

## 2020-03-07 MED ORDER — OMEPRAZOLE 20 MG PO CPDR: 20.0000 mg | DELAYED_RELEASE_CAPSULE | Freq: Every day | ORAL | 3 refills | Status: AC

## 2020-03-07 MED ORDER — TRULICITY 1.5 MG/0.5ML ~~LOC~~ SOAJ: SUBCUTANEOUS | 3 refills | Status: DC

## 2020-03-07 MED ORDER — TADALAFIL 20 MG PO TABS: 20.0000 mg | ORAL_TABLET | ORAL | 3 refills | Status: DC | PRN

## 2020-03-07 MED ORDER — GABAPENTIN 600 MG PO TABS: 600.0000 mg | ORAL_TABLET | Freq: Three times a day (TID) | ORAL | 3 refills | Status: AC

## 2020-03-07 MED ORDER — AZITHROMYCIN 250 MG PO TABS: ORAL_TABLET | ORAL | 0 refills | Status: DC

## 2020-03-07 MED ORDER — ATORVASTATIN CALCIUM 40 MG PO TABS: 40.0000 mg | ORAL_TABLET | Freq: Every day | ORAL | 3 refills | Status: DC

## 2020-03-07 MED ORDER — DAPAGLIFLOZIN PROPANEDIOL 5 MG PO TABS: 5.0000 mg | ORAL_TABLET | Freq: Every day | ORAL | 11 refills | Status: DC

## 2020-03-07 MED ORDER — LOSARTAN POTASSIUM 25 MG PO TABS: 25.0000 mg | ORAL_TABLET | Freq: Every day | ORAL | 3 refills | Status: DC

## 2020-03-07 MED ORDER — METFORMIN HCL ER 500 MG PO TB24: ORAL_TABLET | ORAL | 3 refills | Status: DC

## 2020-03-07 NOTE — Progress Notes (Signed)
Patient ID: Joshua Campbell, male    DOB: 04-22-1960, 60 y.o.   MRN: [id number]  This visit was conducted in person.  BP 120/62   Pulse 86   Temp 98.2 F (36.8 C) (Temporal)   Ht '5\' 11"'  (1.803 m)   Wt 243 lb (110.2 kg)   SpO2 (!) 89%   BMI 33.89 kg/m    CC:  Chief Complaint  Patient presents with  . Annual Exam    Subjective:   HPI: Joshua Campbell is a 60 y.o. male presenting on 03/07/2020 for Annual Exam [place] locally as well.  Hypoxia, new.. O2 Sats in office today 89% at rest... he repots he is getting over a respiratory illness on day 9. Had negative COVID test.  Cough  Productive nly, mainly congestion, no fever..he feels he is almost over it.  No sinus pressure,  No ear pain.  He is a  Former smoker.  Patellar fracture.. now followed by Ortho.Marland Kitchen occurred after fall.. pain is slowly getting better.  Diabetes:  Last Check prior to these labs 2020.  Poor control on trulicity 1.5 mg weekly, metformin 2 tabs daily Lab Results  Component Value Date   HGBA1C 9.8 (H) 02/29/2020  Using medications without difficulties: Hypoglycemic episodes:none Hyperglycemic episodes: yes Feet problems: none Blood Sugars averaging: FBS  200 eye exam within last year: scheduled next week.  Using gabapentin for chronic back pain, No longer on narcotics.  Low back husrts when standing.  Elevated Cholesterol:  LDL at goal  On atorvastatin 40 mg daily Lab Results  Component Value Date   CHOL 156 02/29/2020   HDL 48.70 02/29/2020   LDLCALC 72 02/29/2020   LDLDIRECT 165.0 09/19/2017   TRIG 173.0 (H) 02/29/2020   CHOLHDL 3 02/29/2020  Using medications without problems: Muscle aches:  Diet compliance: Exercise: Other complaints:  Hypertension:  On losartan 25 mg daily at goal BP Readings from Last 3 Encounters:  03/07/20 120/62  01/26/20 (!) 144/70  02/24/18 130/70  Using medication without problems or lightheadedness: occ Chest  pain with exertion:none Edema: none Short of breath: some  SOB with exertion Average home BPs: Other issues:        Relevant past medical, surgical, family and social history reviewed and updated as indicated. Interim medical history since our last visit reviewed. Allergies and medications reviewed and updated. Outpatient Medications Prior to Visit  Medication Sig Dispense Refill  . ACCU-CHEK AVIVA PLUS test strip Use to check blood sugars daily.  Dx: 11.65 100 each 3  . Accu-Chek Softclix Lancets lancets Use to check blood sugars daily.  Dx: 11.65 100 each 3  . atorvastatin (LIPITOR) 40 MG tablet TAKE 1 TABLET BY MOUTH EVERY DAY 90 tablet 0  . Blood Glucose Monitoring Suppl (ACCU-CHEK AVIVA PLUS) w/Device KIT Use to check blood sugars daily.  Dx: 11.65 1 kit 0  . cetirizine (ZYRTEC) 10 MG tablet Take 10 mg by mouth daily.    . Cholecalciferol (VITAMIN D-3) 125 MCG (5000 UT) TABS Take 1 tablet by mouth daily.    Marland Kitchen gabapentin (NEURONTIN) 600 MG tablet Take 1 tablet (600 mg total) by mouth 3 (three) times daily. 270 tablet 1  . losartan (COZAAR) 25 MG tablet TAKE 1 TABLET BY MOUTH EVERY DAY 90 tablet 0  . metFORMIN (GLUCOPHAGE-XR) 500 MG 24 hr tablet TAKE 2 TABLETS EVERY DAY WITH BREAKFAST 180 tablet 0  . Nutritional Supplements (KETO PO)  Take 2 tablets by mouth at bedtime.    Marland Kitchen omeprazole (PRILOSEC) 20 MG capsule Take 20 mg by mouth daily.    . tadalafil (ADCIRCA/CIALIS) 20 MG tablet Take 1 tablet (20 mg total) by mouth every 3 (three) days as needed for erectile dysfunction. 90 tablet 1  . TRULICITY 1.5 ZY/2.4MG SOPN INJECT 1.5 MG INTO THE SKIN ONCE A WEEK. 12 pen 0  . fluticasone (FLONASE) 50 MCG/ACT nasal spray SPRAY 2 SPRAYS INTO EACH NOSTRIL EVERY DAY 16 g 11  . sodium chloride (OCEAN) 0.65 % SOLN nasal spray Place 1 spray into both nostrils as needed for congestion. 30 mL 0   No facility-administered medications prior to visit.     Per HPI unless specifically indicated in ROS  section below Review of Systems  Constitutional: Negative for fatigue and fever.  HENT: Negative for ear pain.   Eyes: Negative for pain.  Respiratory: Positive for shortness of breath. Negative for cough.   Cardiovascular: Negative for chest pain, palpitations and leg swelling.  Gastrointestinal: Negative for abdominal pain.  Genitourinary: Negative for dysuria.  Musculoskeletal: Negative for arthralgias.  Neurological: Negative for syncope, light-headedness and headaches.  Psychiatric/Behavioral: Negative for dysphoric mood.   Objective:  BP 120/62   Pulse 86   Temp 98.2 F (36.8 C) (Temporal)   Ht '5\' 11"'  (1.803 m)   Wt 243 lb (110.2 kg)   SpO2 (!) 89%   BMI 33.89 kg/m   Wt Readings from Last 3 Encounters:  03/07/20 243 lb (110.2 kg)  01/26/20 248 lb (112.5 kg)  02/24/18 239 lb 8 oz (108.6 kg)      Physical Exam Constitutional:      Appearance: He is well-developed.  HENT:     Head: Normocephalic.     Right Ear: Hearing normal.     Left Ear: Hearing normal.     Nose: Nose normal.  Neck:     Thyroid: No thyroid mass or thyromegaly.     Vascular: No carotid bruit.     Trachea: Trachea normal.  Cardiovascular:     Rate and Rhythm: Normal rate and regular rhythm.     Pulses: Normal pulses.     Heart sounds: Heart sounds not distant. No murmur heard. No friction rub. No gallop.      Comments: No peripheral edema Pulmonary:     Effort: Pulmonary effort is normal. No respiratory distress.     Breath sounds: Decreased air movement present. Wheezing present.  Skin:    General: Skin is warm and dry.     Findings: No rash.  Psychiatric:        Speech: Speech normal.        Behavior: Behavior normal.        Thought Content: Thought content normal.      Diabetic foot exam: Normal inspection No skin breakdown No calluses  Normal DP pulses Normal sensation to light touch and monofilament Nails normal  Results for orders placed or performed in visit on 02/29/20   PSA  Result Value Ref Range   PSA 0.39 0.10 - 4.00 ng/mL  Comprehensive metabolic panel  Result Value Ref Range   Sodium 132 (L) 135 - 145 mEq/L   Potassium 4.3 3.5 - 5.1 mEq/L   Chloride 92 (L) 96 - 112 mEq/L   CO2 33 (H) 19 - 32 mEq/L   Glucose, Bld 244 (H) 70 - 99 mg/dL   BUN 12 6 - 23 mg/dL   Creatinine, Ser 0.79 0.40 -  1.50 mg/dL   Total Bilirubin 1.0 0.2 - 1.2 mg/dL   Alkaline Phosphatase 59 39 - 117 U/L   AST 17 0 - 37 U/L   ALT 17 0 - 53 U/L   Total Protein 7.3 6.0 - 8.3 g/dL   Albumin 4.3 3.5 - 5.2 g/dL   GFR 96.18 >60.00 mL/min   Calcium 9.8 8.4 - 10.5 mg/dL  Lipid panel  Result Value Ref Range   Cholesterol 156 0 - 200 mg/dL   Triglycerides 173.0 (H) 0.0 - 149.0 mg/dL   HDL 48.70 >39.00 mg/dL   VLDL 34.6 0.0 - 40.0 mg/dL   LDL Cholesterol 72 0 - 99 mg/dL   Total CHOL/HDL Ratio 3    NonHDL 106.92   Hemoglobin A1c  Result Value Ref Range   Hgb A1c MFr Bld 9.8 (H) 4.6 - 6.5 %    This visit occurred during the SARS-CoV-2 public health emergency.  Safety protocols were in place, including screening questions prior to the visit, additional usage of staff PPE, and extensive cleaning of exam room while observing appropriate contact time as indicated for disinfecting solutions.   COVID 19 screen:  No recent travel or known exposure to COVID19 The patient denies respiratory symptoms of COVID 19 at this time. The importance of social distancing was discussed today.   Assessment and Plan The patient's preventative maintenance and recommended screening tests for an annual wellness exam were reviewed in full today. Brought up to date unless services declined.  Counselled on the importance of diet, exercise, and its role in overall health and mortality. The patient's FH and SH was reviewed, including their home life, tobacco status, and drug and alcohol status.    Vaccines: Due for COVID, refused flu, consider shingrix Prostate Cancer Screen:  Lab Results  Component  Value Date   PSA 0.39 02/29/2020   PSA 1.09 02/20/2018   PSA 0.56 05/10/2016   Colon Cancer Screen: 11/2007, he plans on calling Dr. Collene Mares      Smoking Status: former ETOH/ drug use: cut  down some on alcohol.. daily 6-12 per day.  Hep C:  done  HIV screen:   due    Problem List Items Addressed This Visit    Acute bronchitis    Treat with prednisone and antibiotics.       Chronic thoracic back pain (Chronic)    Using gabapentin for chronic back pain, No longer on narcotics.      Relevant Medications   gabapentin (NEURONTIN) 600 MG tablet   predniSONE (DELTASONE) 20 MG tablet   Hyperlipidemia associated with type 2 diabetes mellitus (HCC) (Chronic)    Stable, chronic.  Continue current medication.   Atorvastatin 40 mg daily      Relevant Medications   atorvastatin (LIPITOR) 40 MG tablet   metFORMIN (GLUCOPHAGE-XR) 500 MG 24 hr tablet   Dulaglutide (TRULICITY) 1.5 WU/9.8JX SOPN   Hypertension associated with diabetes (HCC) (Chronic)    Stable, chronic.  Continue current medication.   Losartan 25 mg daily      Relevant Medications   tadalafil (CIALIS) 20 MG tablet   atorvastatin (LIPITOR) 40 MG tablet   metFORMIN (GLUCOPHAGE-XR) 500 MG 24 hr tablet   Dulaglutide (TRULICITY) 1.5 BJ/4.7WG SOPN   Hypoxia     COPD vs acute oxygen requirement.. if not improving with antibiotics and prednisone needs further eval.       Uncontrolled diabetes mellitus with circulatory complication (HCC) (Chronic)    Poor control on trulicity 1.5 mg weekly,  metformin 2 tabs daily alone. Start Farxiga in addition to [employer] and Trulicity.. call in 2 weeks if FBS is NOT < 120 fasting or < 180  2 hours after a meal.   Due for eye exam.      Relevant Medications   atorvastatin (LIPITOR) 40 MG tablet   metFORMIN (GLUCOPHAGE-XR) 500 MG 24 hr tablet   Dulaglutide (TRULICITY) 1.5 PP/8.9QM SOPN    Other Visit Diagnoses    Routine general medical examination at a health care facility     -  Primary     Meds ordered this encounter  Medications  . tadalafil (CIALIS) 20 MG tablet    Sig: Take 1 tablet (20 mg total) by mouth every 3 (three) days as needed for erectile dysfunction.    Dispense:  90 tablet    Refill:  3  . atorvastatin (LIPITOR) 40 MG tablet    Sig: Take 1 tablet (40 mg total) by mouth daily.    Dispense:  90 tablet    Refill:  3  . DISCONTD: losartan (COZAAR) 25 MG tablet    Sig: Take 1 tablet (25 mg total) by mouth daily.    Dispense:  90 tablet    Refill:  3  . metFORMIN (GLUCOPHAGE-XR) 500 MG 24 hr tablet    Sig: Take 2 tablets every day with breakfast    Dispense:  180 tablet    Refill:  3  . Dulaglutide (TRULICITY) 1.5 KJ/0.3XY SOPN    Sig: INJECT 1.5 MG INTO THE SKIN ONCE A WEEK.    Dispense:  6 mL    Refill:  3    PT REQUESTING 90 DAY SUPPLY  . gabapentin (NEURONTIN) 600 MG tablet    Sig: Take 1 tablet (600 mg total) by mouth 3 (three) times daily.    Dispense:  270 tablet    Refill:  3  . omeprazole (PRILOSEC) 20 MG capsule    Sig: Take 1 capsule (20 mg total) by mouth daily.    Dispense:  90 capsule    Refill:  3  . DISCONTD: dapagliflozin propanediol (FARXIGA) 5 MG TABS tablet    Sig: Take 1 tablet (5 mg total) by mouth daily before breakfast.    Dispense:  30 tablet    Refill:  11  . predniSONE (DELTASONE) 20 MG tablet    Sig: Take 1 tablet (20 mg total) by mouth daily with breakfast.    Dispense:  15 tablet    Refill:  0  . azithromycin (ZITHROMAX) 250 MG tablet    Sig: 2 tab po x 1 day then 1 tab po daily    Dispense:  6 tablet    Refill:  0     Eliezer Lofts, MD

## 2020-03-07 NOTE — Assessment & Plan Note (Signed)
Stable, chronic.  Continue current medication.  Losartan 25 mg daily 

## 2020-03-07 NOTE — Patient Instructions (Addendum)
Set up yearly eye exam for diabetes and have the opthalmologist send [place] a copy of the evaluation for the chart. Call Dr. Loreta Ave to set up repeat colonoscopy for colon cancer screening.  Cut back on beer to no more than 2 beer a day.  Start Farxiga in addition to [employer] and Trulicity.. call in 2 weeks if FBS is NOT < 120 fasting or < 180  2 hours after a meal.  Complete course of prednisone and antibiotics.  Goal pulse x is > 90%

## 2020-03-07 NOTE — Assessment & Plan Note (Signed)
Stable, chronic.  Continue current medication.   Atorvastatin 40 mg daily. 

## 2020-03-16 ENCOUNTER — Other Ambulatory Visit: Payer: Self-pay | Admitting: Family Medicine

## 2020-03-16 NOTE — Telephone Encounter (Signed)
Losartan 25 mg is on long term backorder.  Please advise.

## 2020-03-17 NOTE — Telephone Encounter (Signed)
Spoke with Mr. Ducharme.  He states he currently has 42 tablets of the Losartan 25 mg so he does not need a refill at this time.  He is hoping that when he does need a refill, it will be back in stock.  If not he will call me back and address at that time.  FYI to Dr. Ermalene Searing.

## 2020-03-17 NOTE — Telephone Encounter (Signed)
Losartan 50 mg is also on backorder.  They are asking that you change to something else.

## 2020-03-17 NOTE — Telephone Encounter (Signed)
Has he tried a different pharmacy? That would be easier than changing the med!

## 2020-03-17 NOTE — Telephone Encounter (Signed)
Left message for Mr. Vincent to return my call.

## 2020-03-27 DIAGNOSIS — H40013 Open angle with borderline findings, low risk, bilateral: Secondary | ICD-10-CM | POA: Diagnosis not present

## 2020-03-27 DIAGNOSIS — H524 Presbyopia: Secondary | ICD-10-CM | POA: Diagnosis not present

## 2020-03-27 DIAGNOSIS — H2513 Age-related nuclear cataract, bilateral: Secondary | ICD-10-CM | POA: Diagnosis not present

## 2020-03-27 DIAGNOSIS — H25013 Cortical age-related cataract, bilateral: Secondary | ICD-10-CM | POA: Diagnosis not present

## 2020-03-27 DIAGNOSIS — E119 Type 2 diabetes mellitus without complications: Secondary | ICD-10-CM | POA: Diagnosis not present

## 2020-03-27 LAB — HM DIABETES EYE EXAM

## 2020-03-29 ENCOUNTER — Encounter: Payer: Self-pay | Admitting: Family Medicine

## 2020-04-05 ENCOUNTER — Telehealth: Payer: Self-pay | Admitting: *Deleted

## 2020-04-05 MED ORDER — DAPAGLIFLOZIN PROPANEDIOL 10 MG PO TABS: 10.0000 mg | ORAL_TABLET | Freq: Every day | ORAL | 11 refills | Status: DC

## 2020-04-05 NOTE — Telephone Encounter (Signed)
Joshua Campbell notified as instructed by telephone.  He [facility] [address]  He only has 2 tablets of the 5 mg left.

## 2020-04-05 NOTE — Telephone Encounter (Signed)
We will increase the dose of farxiga to 10 mg daily. If he has some left he can increase by taking 2 tabs of 5 mg daily. If not let me know pharmacy and I can send in a new prescription.

## 2020-04-05 NOTE — Telephone Encounter (Signed)
Patient called stating that he was put on Farxiga 5 mg. Patient stated that he was told to call back with an update on his sugar readings. Patient stated that he does not think that it has helped his sugars much and it may need to be increased. Patient stated the lowest his blood sugar has been was 175 and the highest 225-230. Patient stated hs average blood sugar readings have been around 200.  Patient stated that he is okay if the [place] needs to be increased.  [facility]

## 2020-04-26 DIAGNOSIS — R0902 Hypoxemia: Secondary | ICD-10-CM | POA: Insufficient documentation

## 2020-04-26 DIAGNOSIS — J209 Acute bronchitis, unspecified: Secondary | ICD-10-CM | POA: Insufficient documentation

## 2020-04-26 NOTE — Assessment & Plan Note (Signed)
COPD vs acute oxygen requirement.. if not improving with antibiotics and prednisone needs further eval.

## 2020-04-26 NOTE — Assessment & Plan Note (Addendum)
Treat with prednisone and antibiotics.

## 2020-04-26 NOTE — Assessment & Plan Note (Signed)
Using gabapentin for chronic back pain, No longer on narcotics.

## 2020-04-26 NOTE — Assessment & Plan Note (Signed)
Poor control on trulicity 1.5 mg weekly, metformin 2 tabs daily alone. Start Farxiga in addition to [employer] and Trulicity.. call in 2 weeks if FBS is NOT < 120 fasting or < 180  2 hours after a meal.   Due for eye exam.

## 2020-05-01 ENCOUNTER — Telehealth: Payer: Self-pay

## 2020-05-01 NOTE — Telephone Encounter (Signed)
Pt left v/m requesting status of refill to [facility] for losartan 50 mg and metformin 500 mg 24 hr tab. I did not see refill request from [place] in pts chart and I spoke with Tania Ade at [facility] and she will contact [facility] [address] to get a refill transferred for each med to [facility]. Pt notified and voiced understanding. Nothing further needed at this time.

## 2020-05-04 ENCOUNTER — Telehealth: Payer: Self-pay

## 2020-05-04 NOTE — Telephone Encounter (Signed)
Pt requesting refills of lancets, test strips and farxiga be transferred to [facility]. I advised pt to have [facility] contact pts local pharmacy here and have refills transferred. Pt voiced understanding and nothing further needed.

## 2020-07-03 DIAGNOSIS — M5186 Other intervertebral disc disorders, lumbar region: Secondary | ICD-10-CM | POA: Diagnosis not present

## 2020-07-03 DIAGNOSIS — M25552 Pain in left hip: Secondary | ICD-10-CM | POA: Diagnosis not present

## 2020-07-03 DIAGNOSIS — M5136 Other intervertebral disc degeneration, lumbar region: Secondary | ICD-10-CM | POA: Diagnosis not present

## 2020-07-03 DIAGNOSIS — M9903 Segmental and somatic dysfunction of lumbar region: Secondary | ICD-10-CM | POA: Diagnosis not present

## 2020-07-17 DIAGNOSIS — M25511 Pain in right shoulder: Secondary | ICD-10-CM | POA: Diagnosis not present

## 2020-07-19 ENCOUNTER — Other Ambulatory Visit: Payer: Self-pay | Admitting: Orthopedic Surgery

## 2020-07-19 DIAGNOSIS — S43206D Unspecified dislocation of unspecified sternoclavicular joint, subsequent encounter: Secondary | ICD-10-CM

## 2020-07-27 ENCOUNTER — Other Ambulatory Visit: Payer: Self-pay | Admitting: Family Medicine

## 2020-08-04 ENCOUNTER — Ambulatory Visit
Admission: RE | Admit: 2020-08-04 | Discharge: 2020-08-04 | Disposition: A | Discharge status: Home / Self Care | Payer: BC Managed Care – PPO | Source: Ambulatory Visit | Attending: Orthopedic Surgery | Admitting: Orthopedic Surgery

## 2020-08-04 ENCOUNTER — Other Ambulatory Visit: Payer: Self-pay

## 2020-08-04 DIAGNOSIS — R911 Solitary pulmonary nodule: Secondary | ICD-10-CM | POA: Diagnosis not present

## 2020-08-04 DIAGNOSIS — J90 Pleural effusion, not elsewhere classified: Secondary | ICD-10-CM | POA: Diagnosis not present

## 2020-08-04 DIAGNOSIS — R079 Chest pain, unspecified: Secondary | ICD-10-CM | POA: Diagnosis not present

## 2020-08-04 DIAGNOSIS — J439 Emphysema, unspecified: Secondary | ICD-10-CM | POA: Diagnosis not present

## 2020-08-04 DIAGNOSIS — S43206D Unspecified dislocation of unspecified sternoclavicular joint, subsequent encounter: Secondary | ICD-10-CM

## 2020-08-07 DIAGNOSIS — M25511 Pain in right shoulder: Secondary | ICD-10-CM | POA: Diagnosis not present

## 2020-10-23 ENCOUNTER — Other Ambulatory Visit: Payer: Self-pay | Admitting: Family Medicine

## 2021-01-03 DIAGNOSIS — R051 Acute cough: Secondary | ICD-10-CM | POA: Diagnosis not present

## 2021-01-03 DIAGNOSIS — J439 Emphysema, unspecified: Secondary | ICD-10-CM | POA: Diagnosis not present

## 2021-01-03 DIAGNOSIS — J189 Pneumonia, unspecified organism: Secondary | ICD-10-CM | POA: Diagnosis not present

## 2021-01-28 ENCOUNTER — Other Ambulatory Visit: Payer: Self-pay | Admitting: Family Medicine

## 2021-02-08 ENCOUNTER — Other Ambulatory Visit: Payer: Self-pay | Admitting: Family Medicine

## 2021-03-19 IMAGING — DX DG KNEE COMPLETE 4+V*L*
5 series · 5 of 5 positions shown · non-contrast
Comparison: None.

CLINICAL DATA: Knee pain

EXAM:
LEFT KNEE - COMPLETE 4+ VIEW

[knee ap]
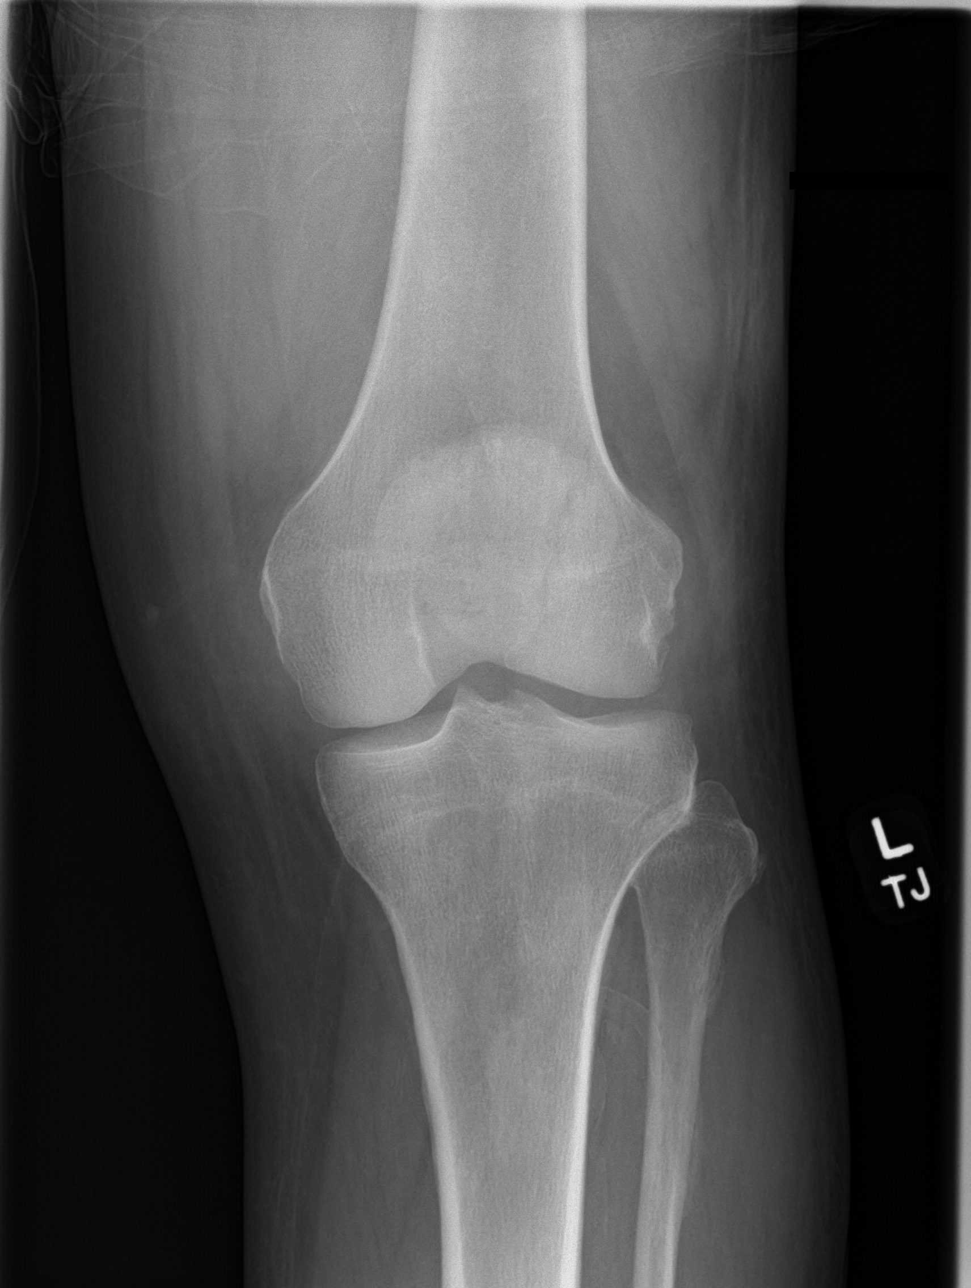

[knee lat]
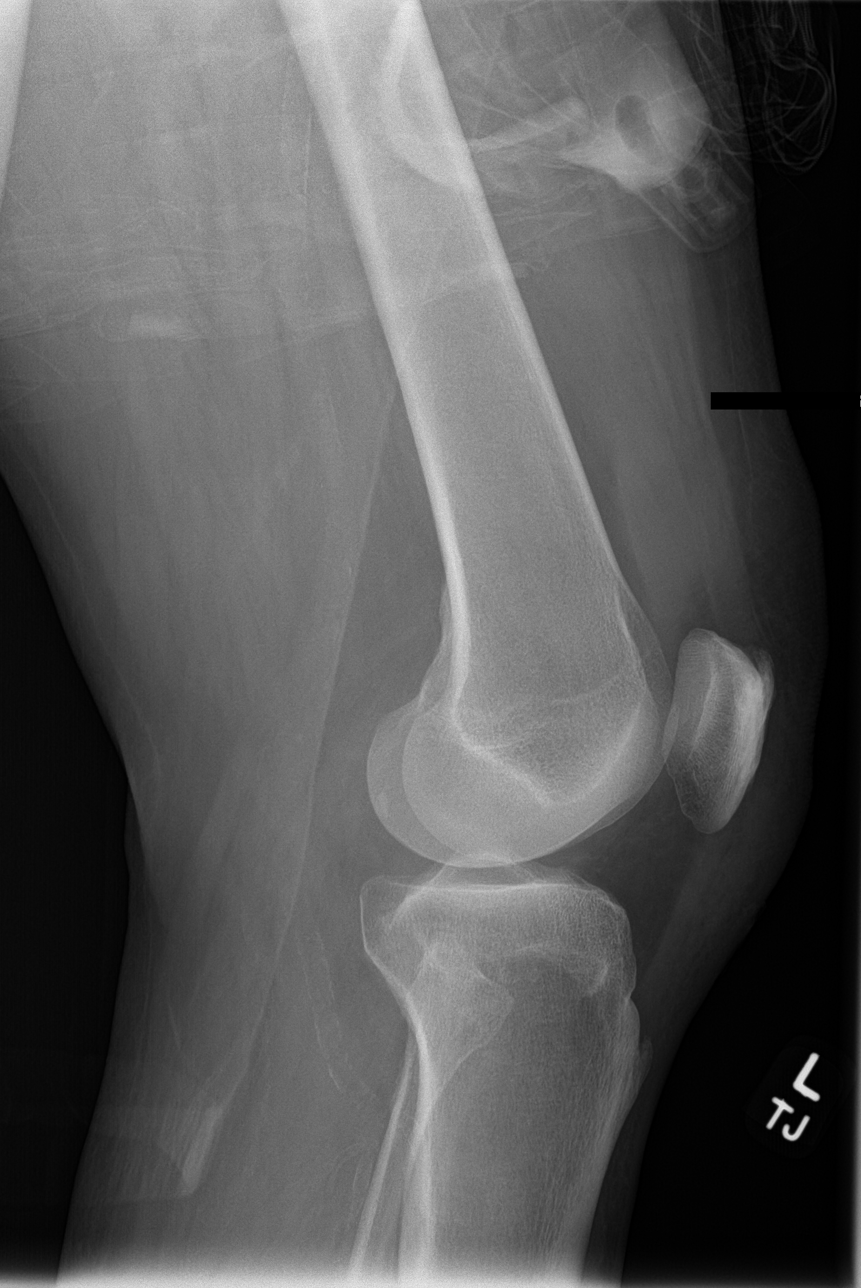

[patella skyline]
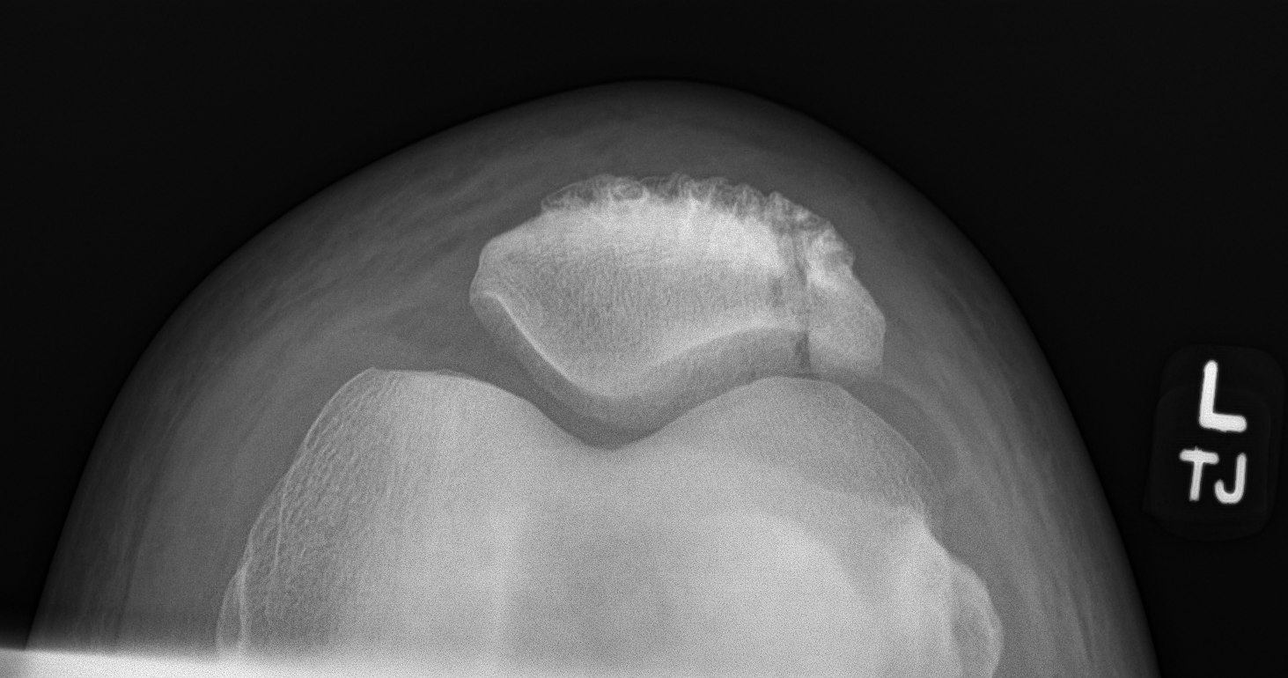

[knee obl (1 of 2)]
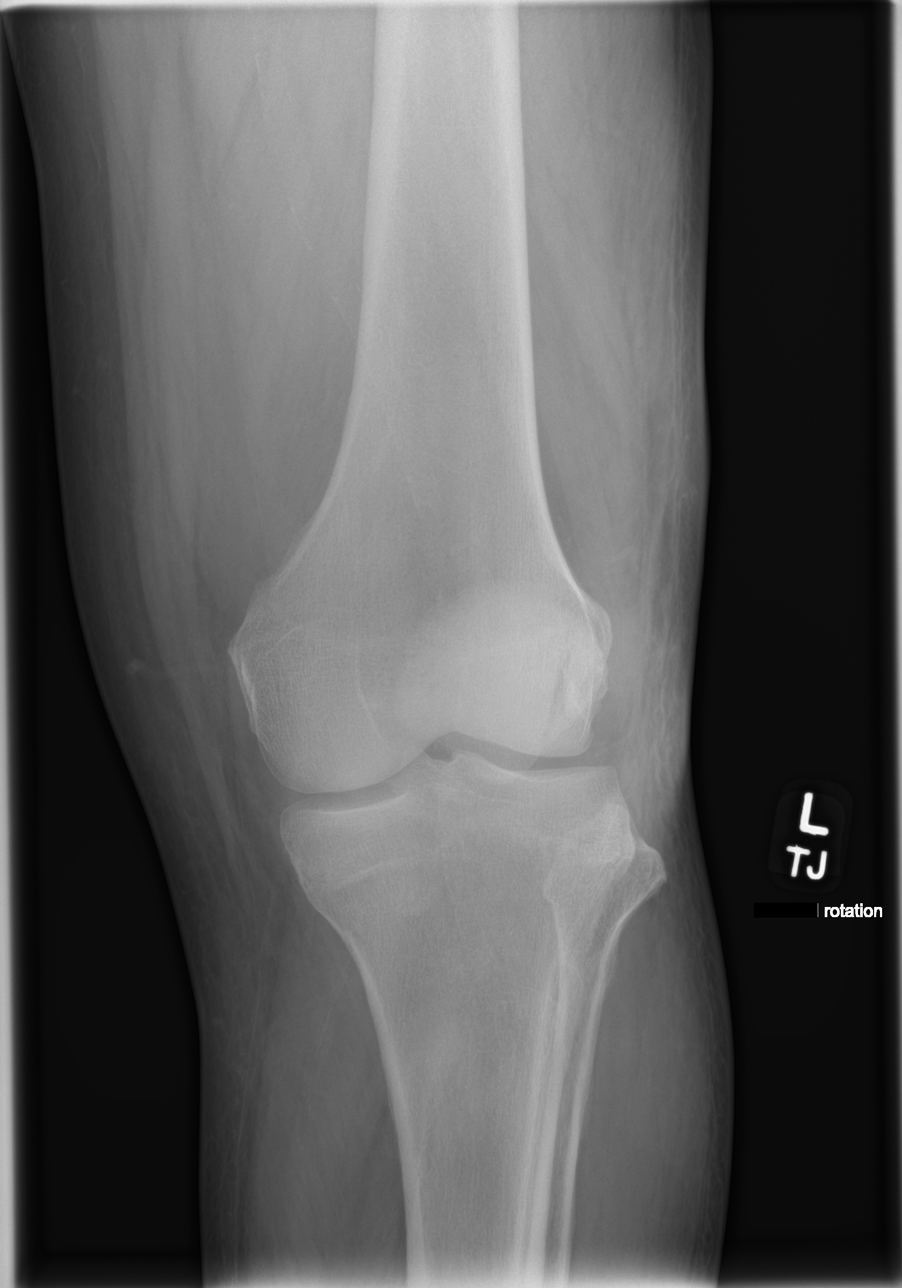

[knee obl (2 of 2)]
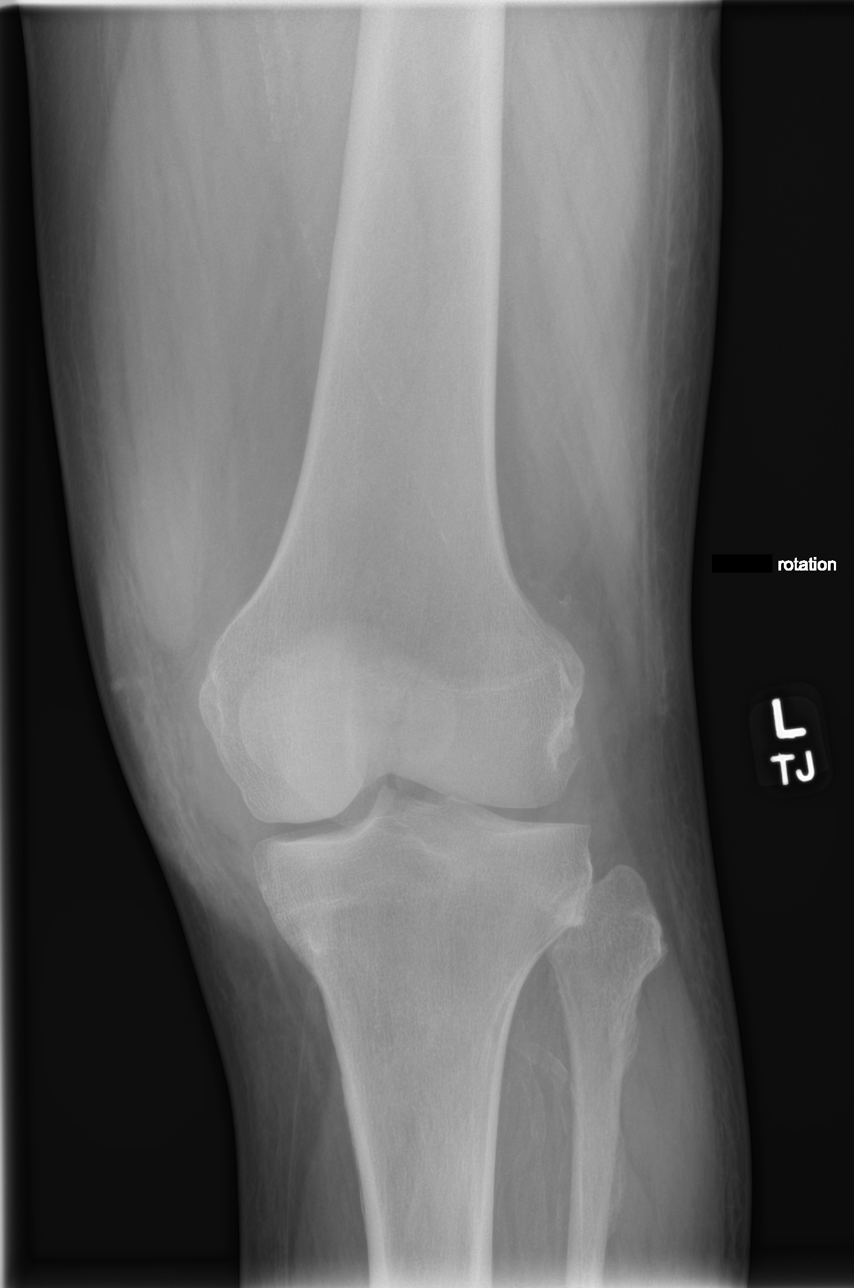

[5 of 5 positions shown; findings below may reference images not displayed]

FINDINGS: Acute nondisplaced fracture involving the lateral facet of the
patella with articular surface extension. No dislocation. Moderate
knee effusion.
IMPRESSION: Acute nondisplaced fracture involving the lateral facet of the
patella with articular surface extension.

These results will be called to the ordering clinician or
representative by the Radiologist Assistant, and communication
documented in the PACS or [REDACTED].

## 2021-03-26 ENCOUNTER — Other Ambulatory Visit: Payer: Self-pay | Admitting: Family Medicine

## 2021-03-26 NOTE — Telephone Encounter (Signed)
Please schedule CPE with fasting labs prior then send back to me to refill his medication.

## 2021-03-30 NOTE — Telephone Encounter (Signed)
Pt on vacation will be back in 1 week and will call to schedule cpe/lab ?

## 2021-03-30 NOTE — Telephone Encounter (Signed)
Pt scheduled for cpe on 04/12/21 at 3:00 ?

## 2021-04-03 ENCOUNTER — Telehealth: Payer: Self-pay | Admitting: Family Medicine

## 2021-04-03 DIAGNOSIS — Z125 Encounter for screening for malignant neoplasm of prostate: Secondary | ICD-10-CM

## 2021-04-03 DIAGNOSIS — E1165 Type 2 diabetes mellitus with hyperglycemia: Secondary | ICD-10-CM

## 2021-04-03 NOTE — Telephone Encounter (Signed)
-----   Message from [name] sent at 04/02/2021 11:47 AM EST ----- ?Regarding: Lab orders for Thursday, 3.9.23 ?Patient is scheduled for CPX labs, please order future labs, Thanks , Terri ? ? ?

## 2021-04-05 ENCOUNTER — Other Ambulatory Visit: Payer: BC Managed Care – PPO

## 2021-04-12 ENCOUNTER — Encounter: Payer: BC Managed Care – PPO | Admitting: Family Medicine

## 2021-04-25 ENCOUNTER — Other Ambulatory Visit: Payer: Self-pay | Admitting: *Deleted

## 2021-04-25 ENCOUNTER — Telehealth: Payer: Self-pay | Admitting: Family Medicine

## 2021-04-25 MED ORDER — DAPAGLIFLOZIN PROPANEDIOL 10 MG PO TABS: 10.0000 mg | ORAL_TABLET | Freq: Every day | ORAL | 0 refills | Status: DC

## 2021-04-25 NOTE — Telephone Encounter (Signed)
Contact patient... does he have appt to be seen? ?

## 2021-04-25 NOTE — Telephone Encounter (Signed)
Pt called asking for an order for blood work. Please advise. ?

## 2021-04-26 NOTE — Telephone Encounter (Signed)
Joshua Campbell,  ?Please call patient and schedule him for a CPE with Dr. Ermalene Searing.  Then send message back to Dr. Ermalene Searing to order labs.  ?

## 2021-04-27 ENCOUNTER — Other Ambulatory Visit: Payer: Self-pay | Admitting: Family Medicine

## 2021-04-30 LAB — HM DIABETES FOOT EXAM

## 2021-05-02 ENCOUNTER — Other Ambulatory Visit: Payer: Self-pay | Admitting: Family Medicine

## 2021-05-02 DIAGNOSIS — Z125 Encounter for screening for malignant neoplasm of prostate: Secondary | ICD-10-CM

## 2021-05-02 DIAGNOSIS — E1165 Type 2 diabetes mellitus with hyperglycemia: Secondary | ICD-10-CM

## 2021-05-02 NOTE — Progress Notes (Signed)
Labs ordered. Will discuss medication at physical in 8 days. ?

## 2021-05-02 NOTE — Telephone Encounter (Signed)
Patient has called the office to discuss the medication again. Both lab and physical appointments have been made for the pt. He does still want to speak with a nurse to discuss his medication refill please advise.  ?

## 2021-05-02 NOTE — Telephone Encounter (Signed)
CPE scheduled 05/10/2021. ?

## 2021-05-07 ENCOUNTER — Other Ambulatory Visit (INDEPENDENT_AMBULATORY_CARE_PROVIDER_SITE_OTHER): Payer: BC Managed Care – PPO

## 2021-05-07 DIAGNOSIS — Z125 Encounter for screening for malignant neoplasm of prostate: Secondary | ICD-10-CM | POA: Diagnosis not present

## 2021-05-07 DIAGNOSIS — E1165 Type 2 diabetes mellitus with hyperglycemia: Secondary | ICD-10-CM | POA: Diagnosis not present

## 2021-05-07 LAB — LIPID PANEL
Cholesterol: 146 mg/dL (ref 0–200)
HDL: 56 mg/dL (ref >39.00)
LDL Cholesterol: 72 mg/dL (ref 0–99)
NonHDL: 90.03
Total CHOL/HDL Ratio: 3
Triglycerides: 88 mg/dL (ref 0.0–149.0)
VLDL: 17.6 mg/dL (ref 0.0–40.0)

## 2021-05-07 LAB — COMPREHENSIVE METABOLIC PANEL
ALT: 16 U/L (ref 0–53)
AST: 19 U/L (ref 0–37)
Albumin: 4.1 g/dL (ref 3.5–5.2)
Alkaline Phosphatase: 56 U/L (ref 39–117)
BUN: 14 mg/dL (ref 6–23)
CO2: 34 mEq/L — ABNORMAL HIGH (ref 19–32)
Calcium: 9.8 mg/dL (ref 8.4–10.5)
Chloride: 96 mEq/L (ref 96–112)
Creatinine, Ser: 0.79 mg/dL (ref 0.40–1.50)
GFR: 95.38 mL/min (ref >60.00)
Glucose, Bld: 111 mg/dL — ABNORMAL HIGH (ref 70–99)
Potassium: 5.2 mEq/L — ABNORMAL HIGH (ref 3.5–5.1)
Sodium: 137 mEq/L (ref 135–145)
Total Bilirubin: 0.7 mg/dL (ref 0.2–1.2)
Total Protein: 6.9 g/dL (ref 6.0–8.3)

## 2021-05-07 LAB — HEMOGLOBIN A1C: Hgb A1c MFr Bld: 7.4 % — ABNORMAL HIGH (ref 4.6–6.5)

## 2021-05-07 LAB — PSA: PSA: 0.32 ng/mL (ref 0.10–4.00)

## 2021-05-07 MED ORDER — TRULICITY 1.5 MG/0.5ML ~~LOC~~ SOAJ: 1.5000 mg | SUBCUTANEOUS | 0 refills | Status: DC

## 2021-05-07 NOTE — Progress Notes (Signed)
Spoke with Joshua Campbell when he came in for his lab draw.  He is due for his Trulicity shot and needs a refill prior to his appointment with Dr. Ermalene Searing on 05/10/2021.  [facility] [address] [facility].  ?

## 2021-05-08 ENCOUNTER — Telehealth: Payer: Self-pay | Admitting: Family Medicine

## 2021-05-08 NOTE — Telephone Encounter (Signed)
Pt returning your call

## 2021-05-08 NOTE — Telephone Encounter (Signed)
Lab results discussed with patient.  See result note. 

## 2021-05-10 ENCOUNTER — Ambulatory Visit (INDEPENDENT_AMBULATORY_CARE_PROVIDER_SITE_OTHER): Payer: BC Managed Care – PPO | Admitting: Family Medicine

## 2021-05-10 ENCOUNTER — Encounter: Payer: Self-pay | Admitting: Family Medicine

## 2021-05-10 VITALS — BP 110/70 | HR 86 | Temp 98.6°F | Ht 70.75 in | Wt 235.4 lb

## 2021-05-10 DIAGNOSIS — J42 Unspecified chronic bronchitis: Secondary | ICD-10-CM

## 2021-05-10 DIAGNOSIS — S91331A Puncture wound without foreign body, right foot, initial encounter: Secondary | ICD-10-CM

## 2021-05-10 DIAGNOSIS — E1169 Type 2 diabetes mellitus with other specified complication: Secondary | ICD-10-CM | POA: Diagnosis not present

## 2021-05-10 DIAGNOSIS — G8929 Other chronic pain: Secondary | ICD-10-CM

## 2021-05-10 DIAGNOSIS — L2989 Other pruritus: Secondary | ICD-10-CM

## 2021-05-10 DIAGNOSIS — M546 Pain in thoracic spine: Secondary | ICD-10-CM

## 2021-05-10 DIAGNOSIS — E1159 Type 2 diabetes mellitus with other circulatory complications: Secondary | ICD-10-CM

## 2021-05-10 DIAGNOSIS — L298 Other pruritus: Secondary | ICD-10-CM

## 2021-05-10 DIAGNOSIS — G6289 Other specified polyneuropathies: Secondary | ICD-10-CM

## 2021-05-10 DIAGNOSIS — Z0000 Encounter for general adult medical examination without abnormal findings: Secondary | ICD-10-CM | POA: Diagnosis not present

## 2021-05-10 DIAGNOSIS — I152 Hypertension secondary to endocrine disorders: Secondary | ICD-10-CM

## 2021-05-10 DIAGNOSIS — E875 Hyperkalemia: Secondary | ICD-10-CM | POA: Diagnosis not present

## 2021-05-10 DIAGNOSIS — E785 Hyperlipidemia, unspecified: Secondary | ICD-10-CM

## 2021-05-10 MED ORDER — MELOXICAM 15 MG PO TABS: 15.0000 mg | ORAL_TABLET | Freq: Every day | ORAL | 1 refills | Status: AC

## 2021-05-10 MED ORDER — TADALAFIL 20 MG PO TABS: 20.0000 mg | ORAL_TABLET | ORAL | 3 refills | Status: DC | PRN

## 2021-05-10 MED ORDER — SPIRIVA HANDIHALER 18 MCG IN CAPS: 18.0000 ug | ORAL_CAPSULE | Freq: Every day | RESPIRATORY_TRACT | 12 refills | Status: AC

## 2021-05-10 NOTE — Assessment & Plan Note (Addendum)
Chronic, improving control ? ?On Farxiga 10 mg daily ?Metformin 500 mg XR 2 tablets daily ?Trulicity 1.5 mg weekly. ? ?Encouraged yearly eye exam. ?Exam performed ? Continue to decrease ETOH intake. ?

## 2021-05-10 NOTE — Assessment & Plan Note (Signed)
Chronic, stable control ? ?Losartan 25 mg daily ?

## 2021-05-10 NOTE — Progress Notes (Signed)
Patient ID: Joshua Campbell, male    DOB: 01-05-1961, 61 y.o.   MRN: [id number]  This visit was conducted in person.  Pulse 86   Temp 98.6 F (37 C) (Oral)   Ht 5' 10.75" (1.797 m)   Wt 235 lb 7 oz (106.8 kg)   SpO2 94%   BMI 33.07 kg/m    CC: Chief Complaint  Patient presents with   Annual Exam    Subjective:   HPI: Joshua Campbell is a 61 y.o. male presenting on 05/10/2021 for Annual Exam  The patient presents for  complete physical and review of chronic health problems. He/She also has the following acute concerns today:   [place]  Hypertension:  Well-controlled on losartan half tab of 50 mg daily  BP Readings from Last 3 Encounters:  05/10/21 110/70  03/07/20 120/62  01/26/20 (!) 144/70  Using medication without problems or lightheadedness:  Chest pain with exertion: Edema: Short of breath: Average home BPs: Other issues:  Diabetes: Improved control on Farxiga 10 mg daily and metformin 500 mg XR 2 tablets daily and Trulicity 1.5 mg weekly Lab Results  Component Value Date   HGBA1C 7.4 (H) 05/07/2021  Using medications without difficulties: Hypoglycemic episodes: none Hyperglycemic episodes: none Feet problems:  cut right foot.. healing blood blister on right sole. Blood Sugars averaging: rarely checking eye exam within last year:Due Wt Readings from Last 3 Encounters:  05/10/21 235 lb 7 oz (106.8 kg)  03/07/20 243 lb (110.2 kg)  01/26/20 248 lb (112.5 kg)     Elevated Cholesterol: LDL low at goal less than 100 on atorvastatin 40 mg daily Lab Results  Component Value Date   CHOL 146 05/07/2021   HDL 56.00 05/07/2021   LDLCALC 72 05/07/2021   LDLDIRECT 165.0 09/19/2017   TRIG 88.0 05/07/2021   CHOLHDL 3 05/07/2021  Using medications without problems: Muscle aches:  Diet compliance: poor. Exercise: limited Other complaints:  Chronic low back pain.. poor control,   using BC powder 1-2 per day.  Limits   activities of daily living such as walking up steps,   Cannot walk more than 100 yards with out stopping. Drinking alcohol for pain at times.Marland Kitchen about 6 pack per day.  On gabapentin   600 mg daily.. 2 on on bad days.  He is tired and has sleep cycle off.  Needs prescription for water softner given recurrent rash/ itching from when he comes home beach   COPD, likely given smoking history of chronic cough.  No lung function  Using albuterol prn.    [place] Office Visit from 05/10/2021 in [place] [facility] Total Score 0        Relevant past medical, surgical, family and social history reviewed and updated as indicated. Interim medical history since our last visit reviewed. Allergies and medications reviewed and updated. Outpatient Medications Prior to Visit  Medication Sig Dispense Refill   albuterol (VENTOLIN HFA) 108 (90 Base) MCG/ACT inhaler albuterol sulfate Take 2 Puff(s) (inhalation) every 4 hours PRN [id number] HFA aerosol inhaler every 4 hours inhalation No set duration recorded days active 90 mcg/actuation     benzonatate (TESSALON) 200 MG capsule benzonatate Take 1 capsule (oral) 3 times per day PRN - Cough [id number] capsule 3 times per day oral No set duration recorded days suspended 200 MG     ACCU-CHEK AVIVA PLUS test strip Use to check blood sugars daily.  Dx: 11.65 100 each 3   Accu-Chek Softclix Lancets lancets Use to check blood sugars daily.  Dx: 11.65 100 each 3   atorvastatin (LIPITOR) 40 MG tablet TAKE 1 TABLET BY MOUTH EVERY DAY 90 tablet 0   Blood Glucose Monitoring Suppl (ACCU-CHEK AVIVA PLUS) w/Device KIT Use to check blood sugars daily.  Dx: 11.65 1 kit 0   cetirizine (ZYRTEC) 10 MG tablet cetirizine Take 1 tablet (oral) 1 time per day No date recorded tablet 1 time per day oral No set duration recorded No set duration amount recorded active 10 mg     Cholecalciferol (VITAMIN D-3) 125 MCG (5000 UT) TABS Take 1 tablet by mouth daily.      cyclobenzaprine (FLEXERIL) 10 MG tablet cyclobenzaprine Take tablet No date recorded tablet No frequency recorded No route recorded No set duration recorded No set duration amount recorded suspended 10 mg     dapagliflozin propanediol (FARXIGA) 10 MG TABS tablet Take 1 tablet (10 mg total) by mouth daily before breakfast. 30 tablet 0   Dulaglutide (TRULICITY) 1.5 MM/0.3FV SOPN Inject 1.5 mg into the skin once a week. 6 mL 0   gabapentin (NEURONTIN) 600 MG tablet Take 1 tablet (600 mg total) by mouth 3 (three) times daily. 270 tablet 3   losartan (COZAAR) 50 MG tablet TAKE 1/2 TABLET BY MOUTH DAILY 45 tablet 0   metFORMIN (GLUCOPHAGE-XR) 500 MG 24 hr tablet TAKE 2 TABLETS EVERY DAY WITH BREAKFAST 180 tablet 0   Nutritional Supplements (KETO PO) Take 2 tablets by mouth at bedtime.     omeprazole (PRILOSEC) 20 MG capsule Take 1 capsule (20 mg total) by mouth daily. 90 capsule 3   predniSONE (DELTASONE) 20 MG tablet Take 1 tablet (20 mg total) by mouth daily with breakfast. 15 tablet 0   tadalafil (CIALIS) 20 MG tablet Take 1 tablet (20 mg total) by mouth every 3 (three) days as needed for erectile dysfunction. 90 tablet 3   azithromycin (ZITHROMAX) 250 MG tablet 2 tab po x 1 day then 1 tab po daily 6 tablet 0   cetirizine (ZYRTEC) 10 MG tablet Take 10 mg by mouth daily.     No facility-administered medications prior to visit.     Per HPI unless specifically indicated in ROS section below Review of Systems  Constitutional:  Negative for fatigue and fever.  HENT:  Negative for ear pain.   Eyes:  Negative for pain.  Respiratory:  Negative for cough and shortness of breath.   Cardiovascular:  Negative for chest pain, palpitations and leg swelling.  Gastrointestinal:  Negative for abdominal pain.  Genitourinary:  Negative for dysuria.  Musculoskeletal:  Negative for arthralgias.  Neurological:  Negative for syncope, light-headedness and headaches.  Psychiatric/Behavioral:  Negative for dysphoric  mood.   Objective:  Pulse 86   Temp 98.6 F (37 C) (Oral)   Ht 5' 10.75" (1.797 m)   Wt 235 lb 7 oz (106.8 kg)   SpO2 94%   BMI 33.07 kg/m   Wt Readings from Last 3 Encounters:  05/10/21 235 lb 7 oz (106.8 kg)  03/07/20 243 lb (110.2 kg)  01/26/20 248 lb (112.5 kg)      Physical Exam Constitutional:      General: He is not in acute distress.    Appearance: Normal appearance. He is well-developed. He is not ill-appearing or toxic-appearing.  HENT:     Head: Normocephalic and atraumatic.     Right Ear: Hearing, tympanic membrane, ear canal and  external ear normal.     Left Ear: Hearing, tympanic membrane, ear canal and external ear normal.     Nose: Nose normal.     Mouth/Throat:     Pharynx: Uvula midline.  Eyes:     General: Lids are normal. Lids are everted, no foreign bodies appreciated.     Conjunctiva/sclera: Conjunctivae normal.     Pupils: Pupils are equal, round, and reactive to light.  Neck:     Thyroid: No thyroid mass or thyromegaly.     Vascular: No carotid bruit.     Trachea: Trachea and phonation normal.  Cardiovascular:     Rate and Rhythm: Normal rate and regular rhythm.     Pulses: Normal pulses.     Heart sounds: S1 normal and S2 normal. No murmur heard.   No gallop.  Pulmonary:     Breath sounds: Normal breath sounds. No wheezing, rhonchi or rales.  Abdominal:     General: Bowel sounds are normal.     Palpations: Abdomen is soft.     Tenderness: There is no abdominal tenderness. There is no guarding or rebound.     Hernia: No hernia is present.  Musculoskeletal:     Cervical back: Normal range of motion and neck supple.  Lymphadenopathy:     Cervical: No cervical adenopathy.  Skin:    General: Skin is warm and dry.     Findings: No rash.  Neurological:     Mental Status: He is alert.     Cranial Nerves: No cranial nerve deficit.     Sensory: No sensory deficit.     Gait: Gait normal.     Deep Tendon Reflexes: Reflexes are normal and  symmetric.  Psychiatric:        Speech: Speech normal.        Behavior: Behavior normal.        Judgment: Judgment normal.      Diabetic foot exam: Normal inspection except scab on center , injured 3 months ago,  no pain. No skin breakdown No calluses  Normal DP pulses decreaed sensation to light touch and monofilament Nails normal   Results for orders placed or performed in visit on 05/07/21  PSA  Result Value Ref Range   PSA 0.32 0.10 - 4.00 ng/mL  Comprehensive metabolic panel  Result Value Ref Range   Sodium 137 135 - 145 mEq/L   Potassium 5.2 No hemolysis seen (H) 3.5 - 5.1 mEq/L   Chloride 96 96 - 112 mEq/L   CO2 34 (H) 19 - 32 mEq/L   Glucose, Bld 111 (H) 70 - 99 mg/dL   BUN 14 6 - 23 mg/dL   Creatinine, Ser 0.79 0.40 - 1.50 mg/dL   Total Bilirubin 0.7 0.2 - 1.2 mg/dL   Alkaline Phosphatase 56 39 - 117 U/L   AST 19 0 - 37 U/L   ALT 16 0 - 53 U/L   Total Protein 6.9 6.0 - 8.3 g/dL   Albumin 4.1 3.5 - 5.2 g/dL   GFR 95.38 >60.00 mL/min   Calcium 9.8 8.4 - 10.5 mg/dL  Lipid panel  Result Value Ref Range   Cholesterol 146 0 - 200 mg/dL   Triglycerides 88.0 0.0 - 149.0 mg/dL   HDL 56.00 >39.00 mg/dL   VLDL 17.6 0.0 - 40.0 mg/dL   LDL Cholesterol 72 0 - 99 mg/dL   Total CHOL/HDL Ratio 3    NonHDL 90.03   Hemoglobin A1c  Result Value Ref Range  Hgb A1c MFr Bld 7.4 (H) 4.6 - 6.5 %    This visit occurred during the SARS-CoV-2 public health emergency.  Safety protocols were in place, including screening questions prior to the visit, additional usage of staff PPE, and extensive cleaning of exam room while observing appropriate contact time as indicated for disinfecting solutions.   COVID 19 screen:  No recent travel or known exposure to COVID19 The patient denies respiratory symptoms of COVID 19 at this time. The importance of social distancing was discussed today.   Assessment and Plan   The patient's preventative maintenance and recommended screening tests  for an annual wellness exam were reviewed in full today. Brought up to date unless services declined.  Counselled on the importance of diet, exercise, and its role in overall health and mortality. The patient's FH and SH was reviewed, including their home life, tobacco status, and drug and alcohol status.   Vaccines: Refused COVID, refused flu, consider shingrix Prostate Cancer Screen:  Lab Results  Component Value Date   PSA 0.32 05/07/2021   PSA 0.39 02/29/2020   PSA 1.09 02/20/2018  Colon Cancer Screen: 11/2007, Dr. Collene Mares, overdue      Smoking Status: former ETOH/ drug use: cut  down some on alcohol.. daily 6-12 per day.  Hep C:  done  HIV screen:   Refused    Procedure: pt gave verbal permission for debridement. Dead tissue debrided off  right sole.  Problem List Items Addressed This Visit     Chronic thoracic back pain (Chronic)    Chronic, trial of meloxicam.  Stop BC powder.       Relevant Medications   cyclobenzaprine (FLEXERIL) 10 MG tablet   meloxicam (MOBIC) 15 MG tablet   Hyperlipidemia associated with type 2 diabetes mellitus (HCC) (Chronic)    Stable, chronic.  Continue current medication.   LDL at goal on atorvastatin 40 mg daily      Relevant Medications   tadalafil (CIALIS) 20 MG tablet   Hypertension associated with diabetes (HCC) (Chronic)    Chronic, stable control  Losartan 25 mg daily       Relevant Medications   tadalafil (CIALIS) 20 MG tablet   Chronic bronchitis (HCC)    Acute, poor control Can try a trial of Spiriva for presumed COPD.      Chronic pruritic rash in adult   Hyperkalemia    Re-evaluate.       Relevant Orders   Potassium (Completed)   Penetrating wound of right foot    Acute, improving  Apply tegaderm to are on right foot.  Call if redness, pain etc.      Peripheral neuropathy   Relevant Medications   cyclobenzaprine (FLEXERIL) 10 MG tablet   Type 2 diabetes mellitus with other circulatory complications  (HCC)    Chronic, improving control  On Farxiga 10 mg daily Metformin 500 mg XR 2 tablets daily Trulicity 1.5 mg weekly.  Encouraged yearly eye exam. Exam performed  Continue to decrease ETOH intake.       Relevant Medications   tadalafil (CIALIS) 20 MG tablet   Other Visit Diagnoses     Routine general medical examination at a health care facility    -  Primary      Meds ordered this encounter  Medications   meloxicam (MOBIC) 15 MG tablet    Sig: Take 1 tablet (15 mg total) by mouth daily.    Dispense:  30 tablet    Refill:  1  tiotropium (SPIRIVA HANDIHALER) 18 MCG inhalation capsule    Sig: Place 1 capsule (18 mcg total) into inhaler and inhale daily.    Dispense:  30 capsule    Refill:  12   tadalafil (CIALIS) 20 MG tablet    Sig: Take 1 tablet (20 mg total) by mouth every 3 (three) days as needed for erectile dysfunction.    Dispense:  30 tablet    Refill:  3     Eliezer Lofts, MD

## 2021-05-10 NOTE — Patient Instructions (Addendum)
Set up yearly eye exam for diabetes and have the opthalmologist send [place] a copy of the evaluation for the chart. ? ? Stop BC powder. ? Decrease alcohol intake.. no more than 2 per day. ? Start meloxicam 15 mg daily ? Can try a trial of Spiriva for presumed COPD. ? Call for increase in Trulicity in 3 months if not at goal. ? ? Please stop at the lab to have labs drawn. ? ? Call Dr. Loreta Ave to set up colonoscopy for colon cancer screening. ? ?Apply tegaderm to are on right foot. ? Call if redness, pain etc. ?

## 2021-05-10 NOTE — Assessment & Plan Note (Signed)
Stable, chronic.  Continue current medication.   LDL at goal on atorvastatin 40 mg daily 

## 2021-05-11 LAB — POTASSIUM: Potassium: 4.5 mEq/L (ref 3.5–5.1)

## 2021-05-19 ENCOUNTER — Other Ambulatory Visit: Payer: Self-pay | Admitting: Family Medicine

## 2021-06-04 ENCOUNTER — Other Ambulatory Visit: Payer: Self-pay | Admitting: Family Medicine

## 2021-06-12 ENCOUNTER — Telehealth: Payer: Self-pay | Admitting: Family Medicine

## 2021-06-12 NOTE — Telephone Encounter (Signed)
Pt called and stated this "These medications that I am on, I quit taking. Medications: tiotropium (SPIRIVA HANDIHALER) 18 MCG inhalation capsule ? ?meloxicam (MOBIC) 15 MG tablet ? ?Want to speak with the nurse about what to do next.  Callback Number: [phone] ?

## 2021-06-12 NOTE — Telephone Encounter (Signed)
Spoke with Joshua Campbell.  He states he used the Spiriva 2 weeks and he felt like it made his symptoms worse so he stopped it.  He is currently just taking Robitussin DM but can't seem to get anything to break up in his chest.   He is asking what to do next. ?He also states that the Meloxicam didn't help at all either.  Please advise.  ?

## 2021-06-12 NOTE — Telephone Encounter (Signed)
I would recommend a follow-up appointment to consider other medications for COPD versus referral to pulmonary.  We can also discuss the fact that meloxicam did not work. ?

## 2021-06-13 DIAGNOSIS — J441 Chronic obstructive pulmonary disease with (acute) exacerbation: Secondary | ICD-10-CM | POA: Diagnosis not present

## 2021-06-13 NOTE — Telephone Encounter (Addendum)
Left message for Mr. Cropper with information below from Dr. Ermalene Searing.  I ask that he call the office back at [phone] to schedule follow up with Dr. Ermalene Searing.  [place] so I doubt he will schedule a follow up.  FYI to Dr. Ermalene Searing.

## 2021-06-14 DIAGNOSIS — E875 Hyperkalemia: Secondary | ICD-10-CM | POA: Insufficient documentation

## 2021-06-14 DIAGNOSIS — G629 Polyneuropathy, unspecified: Secondary | ICD-10-CM | POA: Insufficient documentation

## 2021-06-14 DIAGNOSIS — J42 Unspecified chronic bronchitis: Secondary | ICD-10-CM | POA: Insufficient documentation

## 2021-06-14 NOTE — Assessment & Plan Note (Signed)
Acute, improving  Apply tegaderm to are on right foot.  Call if redness, pain etc.

## 2021-06-14 NOTE — Assessment & Plan Note (Signed)
Re-evaluate

## 2021-06-14 NOTE — Assessment & Plan Note (Signed)
Acute, poor control Can try a trial of Spiriva for presumed COPD.

## 2021-06-14 NOTE — Assessment & Plan Note (Signed)
Chronic, trial of meloxicam.  Stop BC powder.

## 2021-06-27 DIAGNOSIS — J449 Chronic obstructive pulmonary disease, unspecified: Secondary | ICD-10-CM | POA: Diagnosis not present

## 2021-07-14 DIAGNOSIS — E1140 Type 2 diabetes mellitus with diabetic neuropathy, unspecified: Secondary | ICD-10-CM | POA: Diagnosis not present

## 2021-07-14 DIAGNOSIS — E785 Hyperlipidemia, unspecified: Secondary | ICD-10-CM | POA: Diagnosis not present

## 2021-07-14 DIAGNOSIS — Z87891 Personal history of nicotine dependence: Secondary | ICD-10-CM | POA: Diagnosis not present

## 2021-07-14 DIAGNOSIS — K829 Disease of gallbladder, unspecified: Secondary | ICD-10-CM | POA: Diagnosis not present

## 2021-07-14 DIAGNOSIS — K7460 Unspecified cirrhosis of liver: Secondary | ICD-10-CM | POA: Diagnosis not present

## 2021-07-14 DIAGNOSIS — J449 Chronic obstructive pulmonary disease, unspecified: Secondary | ICD-10-CM | POA: Diagnosis not present

## 2021-07-14 DIAGNOSIS — Z6838 Body mass index (BMI) 38.0-38.9, adult: Secondary | ICD-10-CM | POA: Diagnosis not present

## 2021-07-14 DIAGNOSIS — R160 Hepatomegaly, not elsewhere classified: Secondary | ICD-10-CM | POA: Diagnosis not present

## 2021-07-14 DIAGNOSIS — J439 Emphysema, unspecified: Secondary | ICD-10-CM | POA: Diagnosis not present

## 2021-07-14 DIAGNOSIS — I509 Heart failure, unspecified: Secondary | ICD-10-CM | POA: Diagnosis not present

## 2021-07-14 DIAGNOSIS — K7030 Alcoholic cirrhosis of liver without ascites: Secondary | ICD-10-CM | POA: Diagnosis not present

## 2021-07-14 DIAGNOSIS — I5031 Acute diastolic (congestive) heart failure: Secondary | ICD-10-CM | POA: Diagnosis not present

## 2021-07-14 DIAGNOSIS — I110 Hypertensive heart disease with heart failure: Secondary | ICD-10-CM | POA: Diagnosis not present

## 2021-07-14 DIAGNOSIS — J90 Pleural effusion, not elsewhere classified: Secondary | ICD-10-CM | POA: Diagnosis not present

## 2021-07-14 DIAGNOSIS — Z7984 Long term (current) use of oral hypoglycemic drugs: Secondary | ICD-10-CM | POA: Diagnosis not present

## 2021-07-14 DIAGNOSIS — J9601 Acute respiratory failure with hypoxia: Secondary | ICD-10-CM | POA: Diagnosis not present

## 2021-07-14 DIAGNOSIS — K7689 Other specified diseases of liver: Secondary | ICD-10-CM | POA: Diagnosis not present

## 2021-07-14 DIAGNOSIS — R0602 Shortness of breath: Secondary | ICD-10-CM | POA: Diagnosis not present

## 2021-07-14 DIAGNOSIS — Z20822 Contact with and (suspected) exposure to covid-19: Secondary | ICD-10-CM | POA: Diagnosis not present

## 2021-07-14 DIAGNOSIS — R0902 Hypoxemia: Secondary | ICD-10-CM | POA: Diagnosis not present

## 2021-07-14 DIAGNOSIS — E119 Type 2 diabetes mellitus without complications: Secondary | ICD-10-CM | POA: Diagnosis not present

## 2021-07-15 DIAGNOSIS — I509 Heart failure, unspecified: Secondary | ICD-10-CM | POA: Diagnosis not present

## 2021-07-15 DIAGNOSIS — E119 Type 2 diabetes mellitus without complications: Secondary | ICD-10-CM | POA: Diagnosis not present

## 2021-07-15 DIAGNOSIS — J449 Chronic obstructive pulmonary disease, unspecified: Secondary | ICD-10-CM | POA: Diagnosis not present

## 2021-07-15 DIAGNOSIS — J90 Pleural effusion, not elsewhere classified: Secondary | ICD-10-CM | POA: Diagnosis not present

## 2021-07-16 DIAGNOSIS — I509 Heart failure, unspecified: Secondary | ICD-10-CM | POA: Diagnosis not present

## 2021-07-16 DIAGNOSIS — E119 Type 2 diabetes mellitus without complications: Secondary | ICD-10-CM | POA: Diagnosis not present

## 2021-07-16 DIAGNOSIS — J90 Pleural effusion, not elsewhere classified: Secondary | ICD-10-CM | POA: Diagnosis not present

## 2021-07-16 DIAGNOSIS — J449 Chronic obstructive pulmonary disease, unspecified: Secondary | ICD-10-CM | POA: Diagnosis not present

## 2021-07-17 ENCOUNTER — Other Ambulatory Visit: Payer: Self-pay | Admitting: Family Medicine

## 2021-07-17 DIAGNOSIS — J90 Pleural effusion, not elsewhere classified: Secondary | ICD-10-CM | POA: Diagnosis not present

## 2021-07-17 DIAGNOSIS — E119 Type 2 diabetes mellitus without complications: Secondary | ICD-10-CM | POA: Diagnosis not present

## 2021-07-17 DIAGNOSIS — I509 Heart failure, unspecified: Secondary | ICD-10-CM | POA: Diagnosis not present

## 2021-07-17 DIAGNOSIS — J449 Chronic obstructive pulmonary disease, unspecified: Secondary | ICD-10-CM | POA: Diagnosis not present

## 2021-07-18 ENCOUNTER — Other Ambulatory Visit: Payer: Self-pay | Admitting: Family Medicine

## 2021-07-18 DIAGNOSIS — E119 Type 2 diabetes mellitus without complications: Secondary | ICD-10-CM | POA: Diagnosis not present

## 2021-07-18 DIAGNOSIS — J449 Chronic obstructive pulmonary disease, unspecified: Secondary | ICD-10-CM | POA: Diagnosis not present

## 2021-07-18 DIAGNOSIS — J90 Pleural effusion, not elsewhere classified: Secondary | ICD-10-CM | POA: Diagnosis not present

## 2021-07-18 DIAGNOSIS — I509 Heart failure, unspecified: Secondary | ICD-10-CM | POA: Diagnosis not present

## 2021-07-19 DIAGNOSIS — J90 Pleural effusion, not elsewhere classified: Secondary | ICD-10-CM | POA: Diagnosis not present

## 2021-07-19 DIAGNOSIS — I509 Heart failure, unspecified: Secondary | ICD-10-CM | POA: Diagnosis not present

## 2021-07-19 DIAGNOSIS — E119 Type 2 diabetes mellitus without complications: Secondary | ICD-10-CM | POA: Diagnosis not present

## 2021-07-19 DIAGNOSIS — J449 Chronic obstructive pulmonary disease, unspecified: Secondary | ICD-10-CM | POA: Diagnosis not present

## 2021-07-20 DIAGNOSIS — I509 Heart failure, unspecified: Secondary | ICD-10-CM | POA: Diagnosis not present

## 2021-07-20 DIAGNOSIS — J90 Pleural effusion, not elsewhere classified: Secondary | ICD-10-CM | POA: Diagnosis not present

## 2021-07-20 DIAGNOSIS — J449 Chronic obstructive pulmonary disease, unspecified: Secondary | ICD-10-CM | POA: Diagnosis not present

## 2021-07-20 DIAGNOSIS — E119 Type 2 diabetes mellitus without complications: Secondary | ICD-10-CM | POA: Diagnosis not present

## 2021-08-02 ENCOUNTER — Telehealth: Payer: Self-pay | Admitting: Family Medicine

## 2021-08-02 NOTE — Telephone Encounter (Signed)
Noted.  I agree that [place] and with this new possible cancer diagnosis he should set up primary care permanently there.  I will address the blood sugars when he calls in the next week for reevaluation.

## 2021-08-02 NOTE — Telephone Encounter (Signed)
I spoke with pt; pt said was recently at [facility] about cough and SOB; pt had fluid on lungs and leg swelling. A tumor 3.7 cm was found on liver and 2 small spots on galbladder. [facility] did biopsy on tumor but pt has not gotten results yet. Pt last seen by Dr Ermalene Searing on 05/10/2021. Pt has been taking Farxiga 10 mg daily, metformin 500 mg XR taking 2 tabs daily and trulicity 1.5 mg weekly on Tuesdays. Pt said on 08/07/21 [place]. Pt is not sure yet if he is going to come to [place] for 2nd opinion about CA or not. [place] get PCP there also due to following pt [place]. Pt said at 04/2021 visit with Dr Ermalene Searing pt was advised to ck with Dr Ermalene Searing in 3 months about increasing trulicity in 3 months if not at goal. Pt does not ck his BS unless in hospital. Pt does not know what FBS or 2h after meals BS is. Advised pt to know if BS is at goal pt will need to ck FBS and occasional 2 hr after meal BS.pt voiced understanding and will cb next wk with BS readings. Pt wanted Dr Ermalene Searing to be aware of everything going on with pt. Sending note to Dr Ermalene Searing and Lupita Leash CMA.

## 2021-08-02 NOTE — Telephone Encounter (Signed)
Joshua Campbell in [place], [place] has records for him they found a tumor recently. He stated that [facility] talked about doubling up on the: Dulaglutide (TRULICITY) 1.5 MG/0.5ML SOPN. He needs to speak with the nurse, please give a call back when possible, thank you.  Callback Number: [phone] Phone #: [phone] -- [facility]

## 2021-08-06 NOTE — Telephone Encounter (Signed)
I spoke with pt; pt said he has been taking metformin XR 500 mg taking 2 tabs at breakfast and Farxiga 10 mg taking one tab before breakfast and Trulicity 1.5 mg/0.5 ml on Tuesdays. Pt said FBS on 08/06/2021 was 115 and the average FBS last 4 days has been between 115 -131. Pt said on 08/04/21 one hr after eating lunch BS was 141; on 08/05/21 BS one hr after eating lunch was 155. Pt said what ever Dr Ermalene Searing wants pt to do about increasing Trulicity or leaving it the same. Pt denies any symptoms of hypo or hyper glycemia.Pt request refill of [facility]. Sending note to Dr Ermalene Searing who is out of office today and Lupita Leash CMA. Pt request cb whenever Dr Ermalene Searing reviews note.

## 2021-08-06 NOTE — Telephone Encounter (Signed)
Patient called back with some answers to some questions. Call back is [phone]

## 2021-08-07 DIAGNOSIS — Z79899 Other long term (current) drug therapy: Secondary | ICD-10-CM | POA: Diagnosis not present

## 2021-08-07 DIAGNOSIS — D509 Iron deficiency anemia, unspecified: Secondary | ICD-10-CM | POA: Diagnosis not present

## 2021-08-07 DIAGNOSIS — R978 Other abnormal tumor markers: Secondary | ICD-10-CM | POA: Diagnosis not present

## 2021-08-07 DIAGNOSIS — C787 Secondary malignant neoplasm of liver and intrahepatic bile duct: Secondary | ICD-10-CM | POA: Diagnosis not present

## 2021-08-07 DIAGNOSIS — C189 Malignant neoplasm of colon, unspecified: Secondary | ICD-10-CM | POA: Diagnosis not present

## 2021-08-07 MED ORDER — TRULICITY 1.5 MG/0.5ML ~~LOC~~ SOAJ: 1.5000 mg | SUBCUTANEOUS | 1 refills | Status: AC

## 2021-08-07 NOTE — Telephone Encounter (Signed)
Patient is returning phone call about message.

## 2021-08-07 NOTE — Telephone Encounter (Signed)
Spoke with Joshua Campbell.  He states his weight is the least of his worries for now.  He went to the doctor to day and they did confirm he has cancer.  He just wants to stay on the current dose of Trulicity.  Refills sent.  FYI to Dr. Ermalene Searing.

## 2021-08-07 NOTE — Telephone Encounter (Signed)
His blood sugars are at goal for the most part.  I can refill the Trulicity at the current dose for continued blood sugar control.  If you would like to continue working on weight loss or if he has plateaued with his weight loss I can increase the dose.   Is he continuing to lose weight?  Where is his weight at this point?

## 2021-08-07 NOTE — Telephone Encounter (Signed)
Called Joshua Campbell.  His mailbox is full and can't received any messages at this time.

## 2021-08-09 DIAGNOSIS — E119 Type 2 diabetes mellitus without complications: Secondary | ICD-10-CM | POA: Diagnosis not present

## 2021-08-14 DIAGNOSIS — C189 Malignant neoplasm of colon, unspecified: Secondary | ICD-10-CM | POA: Diagnosis not present

## 2021-08-14 DIAGNOSIS — C787 Secondary malignant neoplasm of liver and intrahepatic bile duct: Secondary | ICD-10-CM | POA: Diagnosis not present

## 2021-08-16 DIAGNOSIS — E785 Hyperlipidemia, unspecified: Secondary | ICD-10-CM | POA: Diagnosis not present

## 2021-08-16 DIAGNOSIS — E1140 Type 2 diabetes mellitus with diabetic neuropathy, unspecified: Secondary | ICD-10-CM | POA: Diagnosis not present

## 2021-08-16 DIAGNOSIS — K5790 Diverticulosis of intestine, part unspecified, without perforation or abscess without bleeding: Secondary | ICD-10-CM | POA: Diagnosis not present

## 2021-08-16 DIAGNOSIS — I10 Essential (primary) hypertension: Secondary | ICD-10-CM | POA: Diagnosis not present

## 2021-08-16 DIAGNOSIS — K219 Gastro-esophageal reflux disease without esophagitis: Secondary | ICD-10-CM | POA: Diagnosis not present

## 2021-08-16 DIAGNOSIS — Z87891 Personal history of nicotine dependence: Secondary | ICD-10-CM | POA: Diagnosis not present

## 2021-08-16 DIAGNOSIS — K7460 Unspecified cirrhosis of liver: Secondary | ICD-10-CM | POA: Diagnosis not present

## 2021-08-16 DIAGNOSIS — I110 Hypertensive heart disease with heart failure: Secondary | ICD-10-CM | POA: Diagnosis not present

## 2021-08-16 DIAGNOSIS — K317 Polyp of stomach and duodenum: Secondary | ICD-10-CM | POA: Diagnosis not present

## 2021-08-16 DIAGNOSIS — R092 Respiratory arrest: Secondary | ICD-10-CM | POA: Diagnosis not present

## 2021-08-16 DIAGNOSIS — Z7984 Long term (current) use of oral hypoglycemic drugs: Secondary | ICD-10-CM | POA: Diagnosis not present

## 2021-08-16 DIAGNOSIS — Z794 Long term (current) use of insulin: Secondary | ICD-10-CM | POA: Diagnosis not present

## 2021-08-16 DIAGNOSIS — I5030 Unspecified diastolic (congestive) heart failure: Secondary | ICD-10-CM | POA: Diagnosis not present

## 2021-08-16 DIAGNOSIS — C801 Malignant (primary) neoplasm, unspecified: Secondary | ICD-10-CM | POA: Diagnosis not present

## 2021-08-16 DIAGNOSIS — I509 Heart failure, unspecified: Secondary | ICD-10-CM | POA: Diagnosis not present

## 2021-08-16 DIAGNOSIS — E669 Obesity, unspecified: Secondary | ICD-10-CM | POA: Diagnosis not present

## 2021-08-16 DIAGNOSIS — R160 Hepatomegaly, not elsewhere classified: Secondary | ICD-10-CM | POA: Diagnosis not present

## 2021-08-16 DIAGNOSIS — J90 Pleural effusion, not elsewhere classified: Secondary | ICD-10-CM | POA: Diagnosis not present

## 2021-08-16 DIAGNOSIS — R0600 Dyspnea, unspecified: Secondary | ICD-10-CM | POA: Diagnosis not present

## 2021-08-16 DIAGNOSIS — I5032 Chronic diastolic (congestive) heart failure: Secondary | ICD-10-CM | POA: Diagnosis not present

## 2021-08-16 DIAGNOSIS — I469 Cardiac arrest, cause unspecified: Secondary | ICD-10-CM | POA: Diagnosis not present

## 2021-08-16 DIAGNOSIS — E119 Type 2 diabetes mellitus without complications: Secondary | ICD-10-CM | POA: Diagnosis not present

## 2021-08-16 DIAGNOSIS — J449 Chronic obstructive pulmonary disease, unspecified: Secondary | ICD-10-CM | POA: Diagnosis not present

## 2021-08-16 DIAGNOSIS — K635 Polyp of colon: Secondary | ICD-10-CM | POA: Diagnosis not present

## 2021-08-16 DIAGNOSIS — J439 Emphysema, unspecified: Secondary | ICD-10-CM | POA: Diagnosis not present

## 2021-08-16 DIAGNOSIS — J9691 Respiratory failure, unspecified with hypoxia: Secondary | ICD-10-CM | POA: Diagnosis not present

## 2021-08-28 DIAGNOSIS — C787 Secondary malignant neoplasm of liver and intrahepatic bile duct: Secondary | ICD-10-CM | POA: Diagnosis not present

## 2021-08-28 DIAGNOSIS — D509 Iron deficiency anemia, unspecified: Secondary | ICD-10-CM | POA: Diagnosis not present

## 2021-08-31 DIAGNOSIS — Z9981 Dependence on supplemental oxygen: Secondary | ICD-10-CM | POA: Diagnosis not present

## 2021-08-31 DIAGNOSIS — R0683 Snoring: Secondary | ICD-10-CM | POA: Diagnosis not present

## 2021-08-31 DIAGNOSIS — G4733 Obstructive sleep apnea (adult) (pediatric): Secondary | ICD-10-CM | POA: Diagnosis not present

## 2021-08-31 DIAGNOSIS — J9611 Chronic respiratory failure with hypoxia: Secondary | ICD-10-CM | POA: Diagnosis not present

## 2021-09-03 DIAGNOSIS — G4730 Sleep apnea, unspecified: Secondary | ICD-10-CM | POA: Diagnosis not present

## 2021-09-05 DIAGNOSIS — R0609 Other forms of dyspnea: Secondary | ICD-10-CM | POA: Diagnosis not present

## 2021-09-05 DIAGNOSIS — Z01818 Encounter for other preprocedural examination: Secondary | ICD-10-CM | POA: Diagnosis not present

## 2021-09-06 DIAGNOSIS — Z01811 Encounter for preprocedural respiratory examination: Secondary | ICD-10-CM | POA: Diagnosis not present

## 2021-09-06 DIAGNOSIS — C220 Liver cell carcinoma: Secondary | ICD-10-CM | POA: Diagnosis not present

## 2021-09-06 DIAGNOSIS — G4733 Obstructive sleep apnea (adult) (pediatric): Secondary | ICD-10-CM | POA: Diagnosis not present

## 2021-09-06 DIAGNOSIS — J439 Emphysema, unspecified: Secondary | ICD-10-CM | POA: Diagnosis not present

## 2021-09-06 DIAGNOSIS — J42 Unspecified chronic bronchitis: Secondary | ICD-10-CM | POA: Diagnosis not present

## 2021-09-12 DIAGNOSIS — H25813 Combined forms of age-related cataract, bilateral: Secondary | ICD-10-CM | POA: Diagnosis not present

## 2021-09-12 DIAGNOSIS — E119 Type 2 diabetes mellitus without complications: Secondary | ICD-10-CM | POA: Diagnosis not present

## 2021-09-17 DIAGNOSIS — J984 Other disorders of lung: Secondary | ICD-10-CM | POA: Diagnosis not present

## 2021-09-17 DIAGNOSIS — J449 Chronic obstructive pulmonary disease, unspecified: Secondary | ICD-10-CM | POA: Diagnosis not present

## 2021-09-17 DIAGNOSIS — J439 Emphysema, unspecified: Secondary | ICD-10-CM | POA: Diagnosis not present

## 2021-09-17 DIAGNOSIS — R160 Hepatomegaly, not elsewhere classified: Secondary | ICD-10-CM | POA: Diagnosis not present

## 2021-09-17 DIAGNOSIS — G4733 Obstructive sleep apnea (adult) (pediatric): Secondary | ICD-10-CM | POA: Diagnosis not present

## 2021-09-19 DIAGNOSIS — R0609 Other forms of dyspnea: Secondary | ICD-10-CM | POA: Diagnosis not present

## 2021-09-20 DIAGNOSIS — G4733 Obstructive sleep apnea (adult) (pediatric): Secondary | ICD-10-CM | POA: Diagnosis not present

## 2021-09-20 DIAGNOSIS — D509 Iron deficiency anemia, unspecified: Secondary | ICD-10-CM | POA: Diagnosis not present

## 2021-10-18 DIAGNOSIS — R160 Hepatomegaly, not elsewhere classified: Secondary | ICD-10-CM | POA: Diagnosis not present

## 2021-10-18 DIAGNOSIS — J441 Chronic obstructive pulmonary disease with (acute) exacerbation: Secondary | ICD-10-CM | POA: Diagnosis not present

## 2021-10-18 DIAGNOSIS — K219 Gastro-esophageal reflux disease without esophagitis: Secondary | ICD-10-CM | POA: Diagnosis not present

## 2021-10-18 DIAGNOSIS — Z87891 Personal history of nicotine dependence: Secondary | ICD-10-CM | POA: Diagnosis not present

## 2021-10-18 DIAGNOSIS — I10 Essential (primary) hypertension: Secondary | ICD-10-CM | POA: Diagnosis not present

## 2021-10-18 DIAGNOSIS — E785 Hyperlipidemia, unspecified: Secondary | ICD-10-CM | POA: Diagnosis not present

## 2021-10-18 DIAGNOSIS — E119 Type 2 diabetes mellitus without complications: Secondary | ICD-10-CM | POA: Diagnosis not present

## 2021-10-21 DIAGNOSIS — G4733 Obstructive sleep apnea (adult) (pediatric): Secondary | ICD-10-CM | POA: Diagnosis not present

## 2021-10-23 DIAGNOSIS — G4733 Obstructive sleep apnea (adult) (pediatric): Secondary | ICD-10-CM | POA: Diagnosis not present

## 2021-10-23 DIAGNOSIS — Z87891 Personal history of nicotine dependence: Secondary | ICD-10-CM | POA: Diagnosis not present

## 2021-10-23 DIAGNOSIS — J449 Chronic obstructive pulmonary disease, unspecified: Secondary | ICD-10-CM | POA: Diagnosis not present

## 2021-10-23 DIAGNOSIS — J984 Other disorders of lung: Secondary | ICD-10-CM | POA: Diagnosis not present

## 2021-11-16 ENCOUNTER — Other Ambulatory Visit: Payer: Self-pay | Admitting: Family Medicine

## 2021-11-16 NOTE — Telephone Encounter (Signed)
Last office visit 05/08/21 for CPE.  AVS states to follow up around 11/09/21 for DM.  No future appointments.  Currently undergoing cancer treatment.  Ok to refill?

## 2021-11-20 DIAGNOSIS — G4733 Obstructive sleep apnea (adult) (pediatric): Secondary | ICD-10-CM | POA: Diagnosis not present

## 2021-11-22 ENCOUNTER — Other Ambulatory Visit: Payer: Self-pay | Admitting: Family Medicine

## 2021-11-22 MED ORDER — TADALAFIL 20 MG PO TABS: 20.0000 mg | ORAL_TABLET | ORAL | 3 refills | Status: AC | PRN

## 2021-11-22 NOTE — Telephone Encounter (Signed)
Pt call to Access Nurse, needs refill for Cialis.   [place] Night - Client TELEPHONE ADVICE RECORD [facility] Patient Name: Joshua Campbell Gender: Male DOB: 1960/10/26 Age: 61 Y 9 M 17 D Return Phone Number: [phone] (Primary) Address: City/ State/ Zip: [place] [address] Client [place] Night - Client Client Site [place] Provider Eliezer Lofts - MD Contact Type Call Who Is Calling Patient / Member / Family / Caregiver Call Type Triage / Clinical Relationship To Patient Self Return Phone Number [phone] (Primary) Chief Complaint Prescription Refill or Medication Request (non symptomatic) Reason for Call Medication Question / Request Initial Comment Caller states he needs some medication refills. Translation No Nurse Assessment Nurse: Laurann Montana, RN, Fransico Meadow Date/Time Eilene Ghazi Time): 11/21/2021 5:23:17 PM Please select the assessment type ---RX called in but not at pharm Additional Documentation ---Patient states that his refills were supposed to be called in. Caller states that his medication is Cialis. Caller states he has no sx at this time. Caller states he needs a refill on his Lasix, but that is from another doctor at another practice. RN advised caller to contact PCP office tomorrow when opened. Caller verbalized understanding. Document the name of the medication. ---[facility] Pharmacy name and phone number. ---unknown Has the office closed within the last 30 minutes? ---Yes Were you able to reach someone on the backline? ---No Does the client directives allow for assistance with medications after hours? ---No Nurse: Laurann Montana, RN, Fransico Meadow Date/Time Eilene Ghazi Time): 11/21/2021 5:28:04 PM Confirm and document reason for call. If symptomatic, describe symptoms. ---Caller states his medication refill isn't at pharmacy. Does the patient have any new  or worsening symptoms? ---No Disp. Time Eilene Ghazi Time) Disposition Final User 11/21/2021 5:28:30 PM Clinical Call Yes Laurann Montana, RN, Fransico Meadow PLEASE NOTE: All timestamps contained within this report are represented as [place] Standard Time. CONFIDENTIALTY NOTICE: This fax transmission is intended only for the addressee. It contains information that is legally privileged, confidential or otherwise protected from use or disclosure. If you are not the intended recipient, you are strictly prohibited from reviewing, disclosing, copying using or disseminating any of this information or taking any action in reliance on or regarding this information. If you have received this fax in error, please notify us immediately by telephone so that we can arrange for its return to [place]. Phone: [phone], Toll-Free: [phone], Fax: [phone] Page: 2 of 2 Call Id: [id number] Final Disposition 11/21/2021 5:28:30 PM Clinical Call Yes Laurann Montana, RN, Fransico Meadow

## 2021-11-29 DIAGNOSIS — R160 Hepatomegaly, not elsewhere classified: Secondary | ICD-10-CM | POA: Diagnosis not present

## 2021-12-10 DIAGNOSIS — R160 Hepatomegaly, not elsewhere classified: Secondary | ICD-10-CM | POA: Diagnosis not present

## 2021-12-10 DIAGNOSIS — R791 Abnormal coagulation profile: Secondary | ICD-10-CM | POA: Diagnosis not present

## 2021-12-10 DIAGNOSIS — Z79899 Other long term (current) drug therapy: Secondary | ICD-10-CM | POA: Diagnosis not present

## 2021-12-10 DIAGNOSIS — E878 Other disorders of electrolyte and fluid balance, not elsewhere classified: Secondary | ICD-10-CM | POA: Diagnosis not present

## 2021-12-10 DIAGNOSIS — E785 Hyperlipidemia, unspecified: Secondary | ICD-10-CM | POA: Diagnosis not present

## 2021-12-10 DIAGNOSIS — K811 Chronic cholecystitis: Secondary | ICD-10-CM | POA: Diagnosis not present

## 2021-12-10 DIAGNOSIS — Z5331 Laparoscopic surgical procedure converted to open procedure: Secondary | ICD-10-CM | POA: Diagnosis not present

## 2021-12-10 DIAGNOSIS — C221 Intrahepatic bile duct carcinoma: Secondary | ICD-10-CM | POA: Diagnosis not present

## 2021-12-10 DIAGNOSIS — Z800 Family history of malignant neoplasm of digestive organs: Secondary | ICD-10-CM | POA: Diagnosis not present

## 2021-12-10 DIAGNOSIS — Z5181 Encounter for therapeutic drug level monitoring: Secondary | ICD-10-CM | POA: Diagnosis not present

## 2021-12-10 DIAGNOSIS — G8918 Other acute postprocedural pain: Secondary | ICD-10-CM | POA: Diagnosis not present

## 2021-12-10 DIAGNOSIS — J439 Emphysema, unspecified: Secondary | ICD-10-CM | POA: Diagnosis not present

## 2021-12-10 DIAGNOSIS — I959 Hypotension, unspecified: Secondary | ICD-10-CM | POA: Diagnosis not present

## 2021-12-10 DIAGNOSIS — I10 Essential (primary) hypertension: Secondary | ICD-10-CM | POA: Diagnosis not present

## 2021-12-10 DIAGNOSIS — E1165 Type 2 diabetes mellitus with hyperglycemia: Secondary | ICD-10-CM | POA: Diagnosis not present

## 2021-12-10 DIAGNOSIS — J90 Pleural effusion, not elsewhere classified: Secondary | ICD-10-CM | POA: Diagnosis not present

## 2021-12-10 DIAGNOSIS — E669 Obesity, unspecified: Secondary | ICD-10-CM | POA: Diagnosis not present

## 2021-12-10 DIAGNOSIS — E1142 Type 2 diabetes mellitus with diabetic polyneuropathy: Secondary | ICD-10-CM | POA: Diagnosis not present

## 2021-12-10 DIAGNOSIS — C787 Secondary malignant neoplasm of liver and intrahepatic bile duct: Secondary | ICD-10-CM | POA: Diagnosis not present

## 2021-12-10 DIAGNOSIS — T380X5A Adverse effect of glucocorticoids and synthetic analogues, initial encounter: Secondary | ICD-10-CM | POA: Diagnosis not present

## 2021-12-10 DIAGNOSIS — R000 Tachycardia, unspecified: Secondary | ICD-10-CM | POA: Diagnosis not present

## 2021-12-10 DIAGNOSIS — Z87891 Personal history of nicotine dependence: Secondary | ICD-10-CM | POA: Diagnosis not present

## 2021-12-21 DIAGNOSIS — G4733 Obstructive sleep apnea (adult) (pediatric): Secondary | ICD-10-CM | POA: Diagnosis not present

## 2021-12-24 DIAGNOSIS — R2681 Unsteadiness on feet: Secondary | ICD-10-CM | POA: Diagnosis not present

## 2021-12-24 DIAGNOSIS — J441 Chronic obstructive pulmonary disease with (acute) exacerbation: Secondary | ICD-10-CM | POA: Diagnosis not present

## 2021-12-24 DIAGNOSIS — J430 Unilateral pulmonary emphysema [MacLeod's syndrome]: Secondary | ICD-10-CM | POA: Diagnosis not present

## 2021-12-24 DIAGNOSIS — Z9981 Dependence on supplemental oxygen: Secondary | ICD-10-CM | POA: Diagnosis not present

## 2021-12-24 DIAGNOSIS — Z48815 Encounter for surgical aftercare following surgery on the digestive system: Secondary | ICD-10-CM | POA: Diagnosis not present

## 2021-12-24 DIAGNOSIS — E1165 Type 2 diabetes mellitus with hyperglycemia: Secondary | ICD-10-CM | POA: Diagnosis not present

## 2021-12-24 DIAGNOSIS — Z7984 Long term (current) use of oral hypoglycemic drugs: Secondary | ICD-10-CM | POA: Diagnosis not present

## 2021-12-24 DIAGNOSIS — Z87891 Personal history of nicotine dependence: Secondary | ICD-10-CM | POA: Diagnosis not present

## 2021-12-27 DIAGNOSIS — R160 Hepatomegaly, not elsewhere classified: Secondary | ICD-10-CM | POA: Diagnosis not present

## 2021-12-27 DIAGNOSIS — F1090 Alcohol use, unspecified, uncomplicated: Secondary | ICD-10-CM | POA: Diagnosis not present

## 2021-12-27 DIAGNOSIS — E119 Type 2 diabetes mellitus without complications: Secondary | ICD-10-CM | POA: Diagnosis not present

## 2021-12-27 DIAGNOSIS — I5032 Chronic diastolic (congestive) heart failure: Secondary | ICD-10-CM | POA: Diagnosis not present

## 2022-01-01 DIAGNOSIS — R160 Hepatomegaly, not elsewhere classified: Secondary | ICD-10-CM | POA: Diagnosis not present

## 2022-01-02 DIAGNOSIS — Z7984 Long term (current) use of oral hypoglycemic drugs: Secondary | ICD-10-CM | POA: Diagnosis not present

## 2022-01-02 DIAGNOSIS — Z9981 Dependence on supplemental oxygen: Secondary | ICD-10-CM | POA: Diagnosis not present

## 2022-01-02 DIAGNOSIS — Z48815 Encounter for surgical aftercare following surgery on the digestive system: Secondary | ICD-10-CM | POA: Diagnosis not present

## 2022-01-02 DIAGNOSIS — J430 Unilateral pulmonary emphysema [MacLeod's syndrome]: Secondary | ICD-10-CM | POA: Diagnosis not present

## 2022-01-02 DIAGNOSIS — Z87891 Personal history of nicotine dependence: Secondary | ICD-10-CM | POA: Diagnosis not present

## 2022-01-02 DIAGNOSIS — J441 Chronic obstructive pulmonary disease with (acute) exacerbation: Secondary | ICD-10-CM | POA: Diagnosis not present

## 2022-01-02 DIAGNOSIS — E1165 Type 2 diabetes mellitus with hyperglycemia: Secondary | ICD-10-CM | POA: Diagnosis not present

## 2022-01-02 DIAGNOSIS — R2681 Unsteadiness on feet: Secondary | ICD-10-CM | POA: Diagnosis not present

## 2022-01-03 DIAGNOSIS — Z87891 Personal history of nicotine dependence: Secondary | ICD-10-CM | POA: Diagnosis not present

## 2022-01-03 DIAGNOSIS — Z48815 Encounter for surgical aftercare following surgery on the digestive system: Secondary | ICD-10-CM | POA: Diagnosis not present

## 2022-01-03 DIAGNOSIS — R2681 Unsteadiness on feet: Secondary | ICD-10-CM | POA: Diagnosis not present

## 2022-01-03 DIAGNOSIS — E1165 Type 2 diabetes mellitus with hyperglycemia: Secondary | ICD-10-CM | POA: Diagnosis not present

## 2022-01-03 DIAGNOSIS — J430 Unilateral pulmonary emphysema [MacLeod's syndrome]: Secondary | ICD-10-CM | POA: Diagnosis not present

## 2022-01-03 DIAGNOSIS — Z9981 Dependence on supplemental oxygen: Secondary | ICD-10-CM | POA: Diagnosis not present

## 2022-01-03 DIAGNOSIS — Z7984 Long term (current) use of oral hypoglycemic drugs: Secondary | ICD-10-CM | POA: Diagnosis not present

## 2022-01-03 DIAGNOSIS — J441 Chronic obstructive pulmonary disease with (acute) exacerbation: Secondary | ICD-10-CM | POA: Diagnosis not present

## 2022-01-07 DIAGNOSIS — Z48815 Encounter for surgical aftercare following surgery on the digestive system: Secondary | ICD-10-CM | POA: Diagnosis not present

## 2022-01-07 DIAGNOSIS — Z7984 Long term (current) use of oral hypoglycemic drugs: Secondary | ICD-10-CM | POA: Diagnosis not present

## 2022-01-07 DIAGNOSIS — J441 Chronic obstructive pulmonary disease with (acute) exacerbation: Secondary | ICD-10-CM | POA: Diagnosis not present

## 2022-01-07 DIAGNOSIS — J430 Unilateral pulmonary emphysema [MacLeod's syndrome]: Secondary | ICD-10-CM | POA: Diagnosis not present

## 2022-01-07 DIAGNOSIS — Z9981 Dependence on supplemental oxygen: Secondary | ICD-10-CM | POA: Diagnosis not present

## 2022-01-07 DIAGNOSIS — E1165 Type 2 diabetes mellitus with hyperglycemia: Secondary | ICD-10-CM | POA: Diagnosis not present

## 2022-01-07 DIAGNOSIS — R2681 Unsteadiness on feet: Secondary | ICD-10-CM | POA: Diagnosis not present

## 2022-01-07 DIAGNOSIS — Z87891 Personal history of nicotine dependence: Secondary | ICD-10-CM | POA: Diagnosis not present

## 2022-01-09 DIAGNOSIS — R2681 Unsteadiness on feet: Secondary | ICD-10-CM | POA: Diagnosis not present

## 2022-01-09 DIAGNOSIS — E1165 Type 2 diabetes mellitus with hyperglycemia: Secondary | ICD-10-CM | POA: Diagnosis not present

## 2022-01-09 DIAGNOSIS — Z7984 Long term (current) use of oral hypoglycemic drugs: Secondary | ICD-10-CM | POA: Diagnosis not present

## 2022-01-09 DIAGNOSIS — Z9981 Dependence on supplemental oxygen: Secondary | ICD-10-CM | POA: Diagnosis not present

## 2022-01-09 DIAGNOSIS — Z87891 Personal history of nicotine dependence: Secondary | ICD-10-CM | POA: Diagnosis not present

## 2022-01-09 DIAGNOSIS — Z48815 Encounter for surgical aftercare following surgery on the digestive system: Secondary | ICD-10-CM | POA: Diagnosis not present

## 2022-01-09 DIAGNOSIS — J430 Unilateral pulmonary emphysema [MacLeod's syndrome]: Secondary | ICD-10-CM | POA: Diagnosis not present

## 2022-01-09 DIAGNOSIS — J441 Chronic obstructive pulmonary disease with (acute) exacerbation: Secondary | ICD-10-CM | POA: Diagnosis not present

## 2022-01-10 DIAGNOSIS — Z9981 Dependence on supplemental oxygen: Secondary | ICD-10-CM | POA: Diagnosis not present

## 2022-01-10 DIAGNOSIS — J441 Chronic obstructive pulmonary disease with (acute) exacerbation: Secondary | ICD-10-CM | POA: Diagnosis not present

## 2022-01-10 DIAGNOSIS — Z48815 Encounter for surgical aftercare following surgery on the digestive system: Secondary | ICD-10-CM | POA: Diagnosis not present

## 2022-01-10 DIAGNOSIS — R2681 Unsteadiness on feet: Secondary | ICD-10-CM | POA: Diagnosis not present

## 2022-01-10 DIAGNOSIS — E1165 Type 2 diabetes mellitus with hyperglycemia: Secondary | ICD-10-CM | POA: Diagnosis not present

## 2022-01-10 DIAGNOSIS — Z7984 Long term (current) use of oral hypoglycemic drugs: Secondary | ICD-10-CM | POA: Diagnosis not present

## 2022-01-10 DIAGNOSIS — Z87891 Personal history of nicotine dependence: Secondary | ICD-10-CM | POA: Diagnosis not present

## 2022-01-10 DIAGNOSIS — J430 Unilateral pulmonary emphysema [MacLeod's syndrome]: Secondary | ICD-10-CM | POA: Diagnosis not present

## 2022-01-15 DIAGNOSIS — Z79899 Other long term (current) drug therapy: Secondary | ICD-10-CM | POA: Diagnosis not present

## 2022-01-15 DIAGNOSIS — C221 Intrahepatic bile duct carcinoma: Secondary | ICD-10-CM | POA: Diagnosis not present

## 2022-01-20 DIAGNOSIS — G4733 Obstructive sleep apnea (adult) (pediatric): Secondary | ICD-10-CM | POA: Diagnosis not present

## 2022-01-23 DIAGNOSIS — R2681 Unsteadiness on feet: Secondary | ICD-10-CM | POA: Diagnosis not present

## 2022-01-23 DIAGNOSIS — J430 Unilateral pulmonary emphysema [MacLeod's syndrome]: Secondary | ICD-10-CM | POA: Diagnosis not present

## 2022-01-23 DIAGNOSIS — Z7984 Long term (current) use of oral hypoglycemic drugs: Secondary | ICD-10-CM | POA: Diagnosis not present

## 2022-01-23 DIAGNOSIS — E1165 Type 2 diabetes mellitus with hyperglycemia: Secondary | ICD-10-CM | POA: Diagnosis not present

## 2022-01-23 DIAGNOSIS — Z48815 Encounter for surgical aftercare following surgery on the digestive system: Secondary | ICD-10-CM | POA: Diagnosis not present

## 2022-01-23 DIAGNOSIS — Z9981 Dependence on supplemental oxygen: Secondary | ICD-10-CM | POA: Diagnosis not present

## 2022-01-23 DIAGNOSIS — J441 Chronic obstructive pulmonary disease with (acute) exacerbation: Secondary | ICD-10-CM | POA: Diagnosis not present

## 2022-01-23 DIAGNOSIS — Z87891 Personal history of nicotine dependence: Secondary | ICD-10-CM | POA: Diagnosis not present

## 2022-01-26 ENCOUNTER — Other Ambulatory Visit: Payer: Self-pay | Admitting: Family Medicine

## 2022-01-30 NOTE — Telephone Encounter (Signed)
Spoke with patient and he is currently traveling. He stated that he will call when he is back in [place], he isn't sure when he will return.

## 2022-01-30 NOTE — Telephone Encounter (Signed)
Please call patient and schedule a follow-up on diabetes. Send back for refill after appointment is scheduled. 

## 2022-02-03 NOTE — Telephone Encounter (Signed)
Refill or Deny.  Last office visit 05/11/22 for CPE.  AVS states return in 6 months for DM follow up.  See notes where they called patient to schedule appointment below.

## 2022-02-12 DIAGNOSIS — Z79899 Other long term (current) drug therapy: Secondary | ICD-10-CM | POA: Diagnosis not present

## 2022-02-12 DIAGNOSIS — R7989 Other specified abnormal findings of blood chemistry: Secondary | ICD-10-CM | POA: Diagnosis not present

## 2022-02-12 DIAGNOSIS — C221 Intrahepatic bile duct carcinoma: Secondary | ICD-10-CM | POA: Diagnosis not present

## 2022-02-19 DIAGNOSIS — Z87891 Personal history of nicotine dependence: Secondary | ICD-10-CM | POA: Diagnosis not present

## 2022-02-19 DIAGNOSIS — J449 Chronic obstructive pulmonary disease, unspecified: Secondary | ICD-10-CM | POA: Diagnosis not present

## 2022-02-19 DIAGNOSIS — R160 Hepatomegaly, not elsewhere classified: Secondary | ICD-10-CM | POA: Diagnosis not present

## 2022-02-19 DIAGNOSIS — G4733 Obstructive sleep apnea (adult) (pediatric): Secondary | ICD-10-CM | POA: Diagnosis not present

## 2022-02-20 DIAGNOSIS — G4733 Obstructive sleep apnea (adult) (pediatric): Secondary | ICD-10-CM | POA: Diagnosis not present

## 2022-02-21 DIAGNOSIS — E1165 Type 2 diabetes mellitus with hyperglycemia: Secondary | ICD-10-CM | POA: Diagnosis not present

## 2022-02-21 DIAGNOSIS — Z48815 Encounter for surgical aftercare following surgery on the digestive system: Secondary | ICD-10-CM | POA: Diagnosis not present

## 2022-02-21 DIAGNOSIS — Z7984 Long term (current) use of oral hypoglycemic drugs: Secondary | ICD-10-CM | POA: Diagnosis not present

## 2022-02-21 DIAGNOSIS — J430 Unilateral pulmonary emphysema [MacLeod's syndrome]: Secondary | ICD-10-CM | POA: Diagnosis not present

## 2022-02-21 DIAGNOSIS — Z9981 Dependence on supplemental oxygen: Secondary | ICD-10-CM | POA: Diagnosis not present

## 2022-02-21 DIAGNOSIS — Z87891 Personal history of nicotine dependence: Secondary | ICD-10-CM | POA: Diagnosis not present

## 2022-02-21 DIAGNOSIS — R2681 Unsteadiness on feet: Secondary | ICD-10-CM | POA: Diagnosis not present

## 2022-02-21 DIAGNOSIS — J441 Chronic obstructive pulmonary disease with (acute) exacerbation: Secondary | ICD-10-CM | POA: Diagnosis not present

## 2022-03-02 ENCOUNTER — Other Ambulatory Visit: Payer: Self-pay | Admitting: Family Medicine

## 2022-03-03 NOTE — Telephone Encounter (Signed)
Last office visit 05/10/21 for CPE.  Last refilled 02/04/22 for #60 with no refills.  Pharmacy is asking for 90 day refill.  No future appointments.

## 2022-03-12 DIAGNOSIS — C787 Secondary malignant neoplasm of liver and intrahepatic bile duct: Secondary | ICD-10-CM | POA: Diagnosis not present

## 2022-03-12 DIAGNOSIS — Z79899 Other long term (current) drug therapy: Secondary | ICD-10-CM | POA: Diagnosis not present

## 2022-03-12 DIAGNOSIS — C221 Intrahepatic bile duct carcinoma: Secondary | ICD-10-CM | POA: Diagnosis not present

## 2022-03-12 DIAGNOSIS — F1090 Alcohol use, unspecified, uncomplicated: Secondary | ICD-10-CM | POA: Diagnosis not present

## 2022-03-23 DIAGNOSIS — G4733 Obstructive sleep apnea (adult) (pediatric): Secondary | ICD-10-CM | POA: Diagnosis not present

## 2022-04-02 DIAGNOSIS — Z79899 Other long term (current) drug therapy: Secondary | ICD-10-CM | POA: Diagnosis not present

## 2022-04-02 DIAGNOSIS — R978 Other abnormal tumor markers: Secondary | ICD-10-CM | POA: Diagnosis not present

## 2022-04-02 DIAGNOSIS — C221 Intrahepatic bile duct carcinoma: Secondary | ICD-10-CM | POA: Diagnosis not present

## 2022-04-04 DIAGNOSIS — C221 Intrahepatic bile duct carcinoma: Secondary | ICD-10-CM | POA: Diagnosis not present

## 2022-04-04 DIAGNOSIS — C787 Secondary malignant neoplasm of liver and intrahepatic bile duct: Secondary | ICD-10-CM | POA: Diagnosis not present

## 2022-04-08 DIAGNOSIS — D509 Iron deficiency anemia, unspecified: Secondary | ICD-10-CM | POA: Diagnosis not present

## 2022-04-08 DIAGNOSIS — C221 Intrahepatic bile duct carcinoma: Secondary | ICD-10-CM | POA: Diagnosis not present

## 2022-04-12 ENCOUNTER — Other Ambulatory Visit: Payer: Self-pay | Admitting: Family Medicine

## 2022-04-21 DIAGNOSIS — G4733 Obstructive sleep apnea (adult) (pediatric): Secondary | ICD-10-CM | POA: Diagnosis not present

## 2022-04-22 DIAGNOSIS — R160 Hepatomegaly, not elsewhere classified: Secondary | ICD-10-CM | POA: Diagnosis not present

## 2022-04-22 DIAGNOSIS — I5032 Chronic diastolic (congestive) heart failure: Secondary | ICD-10-CM | POA: Diagnosis not present

## 2022-04-22 DIAGNOSIS — F1090 Alcohol use, unspecified, uncomplicated: Secondary | ICD-10-CM | POA: Diagnosis not present

## 2022-04-22 DIAGNOSIS — E119 Type 2 diabetes mellitus without complications: Secondary | ICD-10-CM | POA: Diagnosis not present

## 2022-05-17 ENCOUNTER — Other Ambulatory Visit: Payer: Self-pay | Admitting: Family Medicine

## 2022-05-22 DIAGNOSIS — G4733 Obstructive sleep apnea (adult) (pediatric): Secondary | ICD-10-CM | POA: Diagnosis not present

## 2022-05-27 ENCOUNTER — Other Ambulatory Visit: Payer: Self-pay | Admitting: Family Medicine

## 2022-05-28 DIAGNOSIS — D509 Iron deficiency anemia, unspecified: Secondary | ICD-10-CM | POA: Diagnosis not present

## 2022-05-28 DIAGNOSIS — F1090 Alcohol use, unspecified, uncomplicated: Secondary | ICD-10-CM | POA: Diagnosis not present

## 2022-05-28 DIAGNOSIS — C221 Intrahepatic bile duct carcinoma: Secondary | ICD-10-CM | POA: Diagnosis not present

## 2022-05-28 DIAGNOSIS — Z79899 Other long term (current) drug therapy: Secondary | ICD-10-CM | POA: Diagnosis not present

## 2022-05-28 DIAGNOSIS — C787 Secondary malignant neoplasm of liver and intrahepatic bile duct: Secondary | ICD-10-CM | POA: Diagnosis not present

## 2022-06-03 ENCOUNTER — Other Ambulatory Visit: Payer: Self-pay | Admitting: Family Medicine

## 2022-06-04 NOTE — Telephone Encounter (Signed)
Contact patient.  He needs labs and physical before any other refills. [place]  If he makes an appointment I am okay with refilling it until date of appointment as long as within the next month or 2

## 2022-06-04 NOTE — Telephone Encounter (Signed)
Patient last visit was 05/10/21. No follow up set up. Ok to refill?

## 2022-06-05 NOTE — Telephone Encounter (Signed)
Per records in Care Everywhere,  [place]  FYI to Dr. Ermalene Searing.  I have also removed Dr. Ermalene Searing as his PCP.

## 2022-06-10 DIAGNOSIS — M546 Pain in thoracic spine: Secondary | ICD-10-CM | POA: Diagnosis not present

## 2022-06-10 DIAGNOSIS — M25552 Pain in left hip: Secondary | ICD-10-CM | POA: Diagnosis not present

## 2022-06-10 DIAGNOSIS — M25551 Pain in right hip: Secondary | ICD-10-CM | POA: Diagnosis not present

## 2022-06-10 DIAGNOSIS — G894 Chronic pain syndrome: Secondary | ICD-10-CM | POA: Diagnosis not present

## 2022-06-10 DIAGNOSIS — M545 Low back pain, unspecified: Secondary | ICD-10-CM | POA: Diagnosis not present

## 2022-06-10 DIAGNOSIS — G8929 Other chronic pain: Secondary | ICD-10-CM | POA: Diagnosis not present

## 2022-06-17 DIAGNOSIS — M7918 Myalgia, other site: Secondary | ICD-10-CM | POA: Diagnosis not present

## 2022-06-21 DIAGNOSIS — G4733 Obstructive sleep apnea (adult) (pediatric): Secondary | ICD-10-CM | POA: Diagnosis not present

## 2022-06-27 DIAGNOSIS — C787 Secondary malignant neoplasm of liver and intrahepatic bile duct: Secondary | ICD-10-CM | POA: Diagnosis not present

## 2022-06-27 DIAGNOSIS — C221 Intrahepatic bile duct carcinoma: Secondary | ICD-10-CM | POA: Diagnosis not present

## 2022-06-27 DIAGNOSIS — R978 Other abnormal tumor markers: Secondary | ICD-10-CM | POA: Diagnosis not present

## 2022-06-27 DIAGNOSIS — D509 Iron deficiency anemia, unspecified: Secondary | ICD-10-CM | POA: Diagnosis not present

## 2022-06-27 DIAGNOSIS — Z79899 Other long term (current) drug therapy: Secondary | ICD-10-CM | POA: Diagnosis not present

## 2022-06-27 DIAGNOSIS — F1090 Alcohol use, unspecified, uncomplicated: Secondary | ICD-10-CM | POA: Diagnosis not present

## 2022-07-08 ENCOUNTER — Other Ambulatory Visit: Payer: Self-pay | Admitting: Family Medicine

## 2022-07-22 DIAGNOSIS — G4733 Obstructive sleep apnea (adult) (pediatric): Secondary | ICD-10-CM | POA: Diagnosis not present

## 2022-07-26 ENCOUNTER — Other Ambulatory Visit: Payer: Self-pay | Admitting: Family Medicine

## 2022-07-29 DIAGNOSIS — Z13228 Encounter for screening for other metabolic disorders: Secondary | ICD-10-CM | POA: Diagnosis not present

## 2022-07-29 DIAGNOSIS — E782 Mixed hyperlipidemia: Secondary | ICD-10-CM | POA: Diagnosis not present

## 2022-07-29 DIAGNOSIS — Z130 Encounter for screening for diseases of the blood and blood-forming organs and certain disorders involving the immune mechanism: Secondary | ICD-10-CM | POA: Diagnosis not present

## 2022-07-29 DIAGNOSIS — E119 Type 2 diabetes mellitus without complications: Secondary | ICD-10-CM | POA: Diagnosis not present

## 2022-08-06 DIAGNOSIS — C221 Intrahepatic bile duct carcinoma: Secondary | ICD-10-CM | POA: Diagnosis not present

## 2022-08-12 DIAGNOSIS — G894 Chronic pain syndrome: Secondary | ICD-10-CM | POA: Diagnosis not present

## 2022-08-12 DIAGNOSIS — M545 Low back pain, unspecified: Secondary | ICD-10-CM | POA: Diagnosis not present

## 2022-08-12 DIAGNOSIS — M7918 Myalgia, other site: Secondary | ICD-10-CM | POA: Diagnosis not present

## 2022-08-12 DIAGNOSIS — M546 Pain in thoracic spine: Secondary | ICD-10-CM | POA: Diagnosis not present

## 2022-08-13 DIAGNOSIS — F1090 Alcohol use, unspecified, uncomplicated: Secondary | ICD-10-CM | POA: Diagnosis not present

## 2022-08-13 DIAGNOSIS — C787 Secondary malignant neoplasm of liver and intrahepatic bile duct: Secondary | ICD-10-CM | POA: Diagnosis not present

## 2022-08-13 DIAGNOSIS — C221 Intrahepatic bile duct carcinoma: Secondary | ICD-10-CM | POA: Diagnosis not present

## 2022-08-13 DIAGNOSIS — D509 Iron deficiency anemia, unspecified: Secondary | ICD-10-CM | POA: Diagnosis not present

## 2022-08-20 DIAGNOSIS — G4733 Obstructive sleep apnea (adult) (pediatric): Secondary | ICD-10-CM | POA: Diagnosis not present

## 2022-08-20 DIAGNOSIS — I10 Essential (primary) hypertension: Secondary | ICD-10-CM | POA: Diagnosis not present

## 2022-08-20 DIAGNOSIS — J449 Chronic obstructive pulmonary disease, unspecified: Secondary | ICD-10-CM | POA: Diagnosis not present

## 2022-08-20 DIAGNOSIS — J00 Acute nasopharyngitis [common cold]: Secondary | ICD-10-CM | POA: Diagnosis not present

## 2022-08-21 DIAGNOSIS — G4733 Obstructive sleep apnea (adult) (pediatric): Secondary | ICD-10-CM | POA: Diagnosis not present

## 2022-08-26 DIAGNOSIS — J208 Acute bronchitis due to other specified organisms: Secondary | ICD-10-CM | POA: Diagnosis not present

## 2022-08-27 ENCOUNTER — Other Ambulatory Visit: Payer: Self-pay | Admitting: Family Medicine

## 2022-08-30 DIAGNOSIS — J449 Chronic obstructive pulmonary disease, unspecified: Secondary | ICD-10-CM | POA: Diagnosis not present

## 2022-08-30 DIAGNOSIS — Z0130 Encounter for examination of blood pressure without abnormal findings: Secondary | ICD-10-CM | POA: Diagnosis not present

## 2022-08-30 DIAGNOSIS — J984 Other disorders of lung: Secondary | ICD-10-CM | POA: Diagnosis not present

## 2022-09-21 DIAGNOSIS — G4733 Obstructive sleep apnea (adult) (pediatric): Secondary | ICD-10-CM | POA: Diagnosis not present

## 2022-09-25 DIAGNOSIS — J449 Chronic obstructive pulmonary disease, unspecified: Secondary | ICD-10-CM | POA: Diagnosis not present

## 2022-09-25 DIAGNOSIS — G4733 Obstructive sleep apnea (adult) (pediatric): Secondary | ICD-10-CM | POA: Diagnosis not present

## 2022-10-01 DIAGNOSIS — J449 Chronic obstructive pulmonary disease, unspecified: Secondary | ICD-10-CM | POA: Diagnosis not present

## 2022-10-01 DIAGNOSIS — I10 Essential (primary) hypertension: Secondary | ICD-10-CM | POA: Diagnosis not present

## 2022-10-01 DIAGNOSIS — G4733 Obstructive sleep apnea (adult) (pediatric): Secondary | ICD-10-CM | POA: Diagnosis not present

## 2022-10-01 DIAGNOSIS — E119 Type 2 diabetes mellitus without complications: Secondary | ICD-10-CM | POA: Diagnosis not present

## 2022-10-25 DIAGNOSIS — J449 Chronic obstructive pulmonary disease, unspecified: Secondary | ICD-10-CM | POA: Diagnosis not present

## 2022-10-25 DIAGNOSIS — G4733 Obstructive sleep apnea (adult) (pediatric): Secondary | ICD-10-CM | POA: Diagnosis not present

## 2022-11-05 DIAGNOSIS — Z79899 Other long term (current) drug therapy: Secondary | ICD-10-CM | POA: Diagnosis not present

## 2022-11-05 DIAGNOSIS — C189 Malignant neoplasm of colon, unspecified: Secondary | ICD-10-CM | POA: Diagnosis not present

## 2022-11-07 DIAGNOSIS — C221 Intrahepatic bile duct carcinoma: Secondary | ICD-10-CM | POA: Diagnosis not present

## 2022-11-12 DIAGNOSIS — C189 Malignant neoplasm of colon, unspecified: Secondary | ICD-10-CM | POA: Diagnosis not present

## 2022-11-12 DIAGNOSIS — D509 Iron deficiency anemia, unspecified: Secondary | ICD-10-CM | POA: Diagnosis not present

## 2022-11-25 DIAGNOSIS — J449 Chronic obstructive pulmonary disease, unspecified: Secondary | ICD-10-CM | POA: Diagnosis not present

## 2022-11-25 DIAGNOSIS — G4733 Obstructive sleep apnea (adult) (pediatric): Secondary | ICD-10-CM | POA: Diagnosis not present

## 2022-12-07 ENCOUNTER — Other Ambulatory Visit: Payer: Self-pay | Admitting: Family Medicine

## 2023-01-25 DIAGNOSIS — J449 Chronic obstructive pulmonary disease, unspecified: Secondary | ICD-10-CM | POA: Diagnosis not present

## 2023-01-25 DIAGNOSIS — G4733 Obstructive sleep apnea (adult) (pediatric): Secondary | ICD-10-CM | POA: Diagnosis not present

## 2023-02-25 DIAGNOSIS — J449 Chronic obstructive pulmonary disease, unspecified: Secondary | ICD-10-CM | POA: Diagnosis not present

## 2023-02-25 DIAGNOSIS — G4733 Obstructive sleep apnea (adult) (pediatric): Secondary | ICD-10-CM | POA: Diagnosis not present

## 2023-03-24 ENCOUNTER — Other Ambulatory Visit: Payer: Self-pay | Admitting: Family Medicine

## 2023-03-27 DIAGNOSIS — J449 Chronic obstructive pulmonary disease, unspecified: Secondary | ICD-10-CM | POA: Diagnosis not present

## 2023-03-27 DIAGNOSIS — G4733 Obstructive sleep apnea (adult) (pediatric): Secondary | ICD-10-CM | POA: Diagnosis not present

## 2023-03-31 DIAGNOSIS — C189 Malignant neoplasm of colon, unspecified: Secondary | ICD-10-CM | POA: Diagnosis not present

## 2023-03-31 DIAGNOSIS — Z79899 Other long term (current) drug therapy: Secondary | ICD-10-CM | POA: Diagnosis not present

## 2023-03-31 DIAGNOSIS — C221 Intrahepatic bile duct carcinoma: Secondary | ICD-10-CM | POA: Diagnosis not present

## 2023-03-31 DIAGNOSIS — C787 Secondary malignant neoplasm of liver and intrahepatic bile duct: Secondary | ICD-10-CM | POA: Diagnosis not present

## 2023-04-02 DIAGNOSIS — C189 Malignant neoplasm of colon, unspecified: Secondary | ICD-10-CM | POA: Diagnosis not present

## 2023-04-10 DIAGNOSIS — C189 Malignant neoplasm of colon, unspecified: Secondary | ICD-10-CM | POA: Diagnosis not present

## 2023-04-10 DIAGNOSIS — C221 Intrahepatic bile duct carcinoma: Secondary | ICD-10-CM | POA: Diagnosis not present

## 2023-04-10 DIAGNOSIS — D509 Iron deficiency anemia, unspecified: Secondary | ICD-10-CM | POA: Diagnosis not present

## 2023-04-10 DIAGNOSIS — C787 Secondary malignant neoplasm of liver and intrahepatic bile duct: Secondary | ICD-10-CM | POA: Diagnosis not present

## 2023-04-22 ENCOUNTER — Other Ambulatory Visit: Payer: Self-pay | Admitting: Family Medicine

## 2023-05-09 ENCOUNTER — Telehealth: Payer: Self-pay | Admitting: Family Medicine

## 2023-05-09 NOTE — Telephone Encounter (Signed)
 Copied from CRM [id number]. Topic: Medical Record Request - Records Request >> May 09, 2023 12:49 PM [place] wrote: Reason for CRM: Patient wanting to have all medications and visits sent to the [facility] in [facility]...  fax #[phone] ATTN: NP Verdis Frederickson #[phone] office

## 2023-05-09 NOTE — Telephone Encounter (Signed)
 Amy,  Is there a way to get this phone note sent to HIM.

## 2023-05-12 NOTE — Telephone Encounter (Signed)
 Copied from CRM [id number]. Topic: Medical Record Request - Records Request >> May 09, 2023 12:49 PM [place] wrote: Reason for CRM: Patient wanting to have all medications and visits sent to the [facility] in [facility]...  fax #[phone] ATTN: NP Verdis Frederickson #[phone] office

## 2023-05-12 NOTE — Telephone Encounter (Signed)
 Called pt and wasn't able to leave voice mail will do a follow up call later today and tomorrow

## 2023-05-22 DIAGNOSIS — C229 Malignant neoplasm of liver, not specified as primary or secondary: Secondary | ICD-10-CM | POA: Diagnosis not present

## 2023-05-22 DIAGNOSIS — J449 Chronic obstructive pulmonary disease, unspecified: Secondary | ICD-10-CM | POA: Diagnosis not present

## 2023-05-22 DIAGNOSIS — E119 Type 2 diabetes mellitus without complications: Secondary | ICD-10-CM | POA: Diagnosis not present

## 2023-05-22 DIAGNOSIS — I10 Essential (primary) hypertension: Secondary | ICD-10-CM | POA: Diagnosis not present

## 2023-05-26 DIAGNOSIS — G4733 Obstructive sleep apnea (adult) (pediatric): Secondary | ICD-10-CM | POA: Diagnosis not present

## 2023-05-26 DIAGNOSIS — J449 Chronic obstructive pulmonary disease, unspecified: Secondary | ICD-10-CM | POA: Diagnosis not present

## 2023-05-26 DIAGNOSIS — Z0130 Encounter for examination of blood pressure without abnormal findings: Secondary | ICD-10-CM | POA: Diagnosis not present

## 2023-05-26 DIAGNOSIS — Z77090 Contact with and (suspected) exposure to asbestos: Secondary | ICD-10-CM | POA: Diagnosis not present

## 2023-05-26 DIAGNOSIS — E119 Type 2 diabetes mellitus without complications: Secondary | ICD-10-CM | POA: Diagnosis not present

## 2023-06-05 DIAGNOSIS — Z77090 Contact with and (suspected) exposure to asbestos: Secondary | ICD-10-CM | POA: Diagnosis not present

## 2023-06-25 DIAGNOSIS — G4733 Obstructive sleep apnea (adult) (pediatric): Secondary | ICD-10-CM | POA: Diagnosis not present

## 2023-06-25 DIAGNOSIS — J449 Chronic obstructive pulmonary disease, unspecified: Secondary | ICD-10-CM | POA: Diagnosis not present

## 2023-06-26 DIAGNOSIS — J438 Other emphysema: Secondary | ICD-10-CM | POA: Diagnosis not present

## 2023-06-26 DIAGNOSIS — Z0130 Encounter for examination of blood pressure without abnormal findings: Secondary | ICD-10-CM | POA: Diagnosis not present

## 2023-06-26 DIAGNOSIS — G4733 Obstructive sleep apnea (adult) (pediatric): Secondary | ICD-10-CM | POA: Diagnosis not present

## 2023-07-08 DIAGNOSIS — C189 Malignant neoplasm of colon, unspecified: Secondary | ICD-10-CM | POA: Diagnosis not present

## 2023-07-08 DIAGNOSIS — C221 Intrahepatic bile duct carcinoma: Secondary | ICD-10-CM | POA: Diagnosis not present

## 2023-07-08 DIAGNOSIS — C787 Secondary malignant neoplasm of liver and intrahepatic bile duct: Secondary | ICD-10-CM | POA: Diagnosis not present

## 2023-07-08 DIAGNOSIS — Z79899 Other long term (current) drug therapy: Secondary | ICD-10-CM | POA: Diagnosis not present

## 2023-07-10 DIAGNOSIS — C787 Secondary malignant neoplasm of liver and intrahepatic bile duct: Secondary | ICD-10-CM | POA: Diagnosis not present

## 2023-07-10 DIAGNOSIS — C189 Malignant neoplasm of colon, unspecified: Secondary | ICD-10-CM | POA: Diagnosis not present

## 2023-07-10 DIAGNOSIS — D509 Iron deficiency anemia, unspecified: Secondary | ICD-10-CM | POA: Diagnosis not present

## 2023-07-25 DIAGNOSIS — G4733 Obstructive sleep apnea (adult) (pediatric): Secondary | ICD-10-CM | POA: Diagnosis not present

## 2023-07-25 DIAGNOSIS — J449 Chronic obstructive pulmonary disease, unspecified: Secondary | ICD-10-CM | POA: Diagnosis not present

## 2023-08-25 DIAGNOSIS — G4733 Obstructive sleep apnea (adult) (pediatric): Secondary | ICD-10-CM | POA: Diagnosis not present

## 2023-08-25 DIAGNOSIS — J449 Chronic obstructive pulmonary disease, unspecified: Secondary | ICD-10-CM | POA: Diagnosis not present

## 2023-08-26 DIAGNOSIS — E782 Mixed hyperlipidemia: Secondary | ICD-10-CM | POA: Diagnosis not present

## 2023-08-26 DIAGNOSIS — N529 Male erectile dysfunction, unspecified: Secondary | ICD-10-CM | POA: Diagnosis not present

## 2023-08-26 DIAGNOSIS — E119 Type 2 diabetes mellitus without complications: Secondary | ICD-10-CM | POA: Diagnosis not present

## 2023-08-26 DIAGNOSIS — M546 Pain in thoracic spine: Secondary | ICD-10-CM | POA: Diagnosis not present

## 2023-09-24 DIAGNOSIS — G4733 Obstructive sleep apnea (adult) (pediatric): Secondary | ICD-10-CM | POA: Diagnosis not present

## 2023-09-24 DIAGNOSIS — J449 Chronic obstructive pulmonary disease, unspecified: Secondary | ICD-10-CM | POA: Diagnosis not present

## 2023-09-24 DIAGNOSIS — M65311 Trigger thumb, right thumb: Secondary | ICD-10-CM | POA: Diagnosis not present

## 2023-09-30 DIAGNOSIS — C221 Intrahepatic bile duct carcinoma: Secondary | ICD-10-CM | POA: Diagnosis not present

## 2023-09-30 DIAGNOSIS — Z79899 Other long term (current) drug therapy: Secondary | ICD-10-CM | POA: Diagnosis not present

## 2023-09-30 DIAGNOSIS — C787 Secondary malignant neoplasm of liver and intrahepatic bile duct: Secondary | ICD-10-CM | POA: Diagnosis not present

## 2023-09-30 DIAGNOSIS — C189 Malignant neoplasm of colon, unspecified: Secondary | ICD-10-CM | POA: Diagnosis not present

## 2023-10-02 DIAGNOSIS — C189 Malignant neoplasm of colon, unspecified: Secondary | ICD-10-CM | POA: Diagnosis not present

## 2023-10-02 DIAGNOSIS — D509 Iron deficiency anemia, unspecified: Secondary | ICD-10-CM | POA: Diagnosis not present

## 2023-10-02 DIAGNOSIS — C787 Secondary malignant neoplasm of liver and intrahepatic bile duct: Secondary | ICD-10-CM | POA: Diagnosis not present

## 2023-10-14 DIAGNOSIS — M9902 Segmental and somatic dysfunction of thoracic region: Secondary | ICD-10-CM | POA: Diagnosis not present

## 2023-10-14 DIAGNOSIS — M545 Low back pain, unspecified: Secondary | ICD-10-CM | POA: Diagnosis not present

## 2023-10-14 DIAGNOSIS — M9905 Segmental and somatic dysfunction of pelvic region: Secondary | ICD-10-CM | POA: Diagnosis not present

## 2023-10-14 DIAGNOSIS — M9903 Segmental and somatic dysfunction of lumbar region: Secondary | ICD-10-CM | POA: Diagnosis not present

## 2023-10-21 DIAGNOSIS — M9905 Segmental and somatic dysfunction of pelvic region: Secondary | ICD-10-CM | POA: Diagnosis not present

## 2023-10-21 DIAGNOSIS — M9903 Segmental and somatic dysfunction of lumbar region: Secondary | ICD-10-CM | POA: Diagnosis not present

## 2023-10-21 DIAGNOSIS — M9902 Segmental and somatic dysfunction of thoracic region: Secondary | ICD-10-CM | POA: Diagnosis not present

## 2023-10-21 DIAGNOSIS — M545 Low back pain, unspecified: Secondary | ICD-10-CM | POA: Diagnosis not present

## 2023-10-24 DIAGNOSIS — J449 Chronic obstructive pulmonary disease, unspecified: Secondary | ICD-10-CM | POA: Diagnosis not present

## 2023-10-24 DIAGNOSIS — G4733 Obstructive sleep apnea (adult) (pediatric): Secondary | ICD-10-CM | POA: Diagnosis not present

## 2023-11-24 DIAGNOSIS — J449 Chronic obstructive pulmonary disease, unspecified: Secondary | ICD-10-CM | POA: Diagnosis not present

## 2023-11-24 DIAGNOSIS — G4733 Obstructive sleep apnea (adult) (pediatric): Secondary | ICD-10-CM | POA: Diagnosis not present

## 2023-12-24 DIAGNOSIS — G4733 Obstructive sleep apnea (adult) (pediatric): Secondary | ICD-10-CM | POA: Diagnosis not present

## 2023-12-24 DIAGNOSIS — J449 Chronic obstructive pulmonary disease, unspecified: Secondary | ICD-10-CM | POA: Diagnosis not present

## 2024-01-23 DIAGNOSIS — J449 Chronic obstructive pulmonary disease, unspecified: Secondary | ICD-10-CM | POA: Diagnosis not present

## 2024-01-23 DIAGNOSIS — G4733 Obstructive sleep apnea (adult) (pediatric): Secondary | ICD-10-CM | POA: Diagnosis not present
